# Patient Record
Sex: Male | Born: 1940
Health system: Southern US, Community
[De-identification: ages and names within clinical notes are randomized; demographics above are authoritative.]

## PROBLEM LIST (undated history)

## (undated) DIAGNOSIS — E119 Type 2 diabetes mellitus without complications: Secondary | ICD-10-CM

## (undated) DIAGNOSIS — N529 Male erectile dysfunction, unspecified: Secondary | ICD-10-CM

## (undated) DIAGNOSIS — E785 Hyperlipidemia, unspecified: Secondary | ICD-10-CM

## (undated) DIAGNOSIS — H409 Unspecified glaucoma: Secondary | ICD-10-CM

## (undated) DIAGNOSIS — I10 Essential (primary) hypertension: Secondary | ICD-10-CM

## (undated) HISTORY — DX: Hyperlipidemia, unspecified: E78.5

## (undated) HISTORY — DX: Unspecified glaucoma: H40.9

## (undated) HISTORY — DX: Essential (primary) hypertension: I10

## (undated) HISTORY — DX: Type 2 diabetes mellitus without complications: E11.9

## (undated) HISTORY — DX: Male erectile dysfunction, unspecified: N52.9

---

## 1998-09-26 ENCOUNTER — Ambulatory Visit (HOSPITAL_COMMUNITY): Admission: RE | Admit: 1998-09-26 | Discharge: 1998-09-26 | Payer: Self-pay | Admitting: Neurosurgery

## 1999-05-22 ENCOUNTER — Encounter: Payer: Self-pay | Admitting: Family Medicine

## 1999-05-22 ENCOUNTER — Ambulatory Visit (HOSPITAL_COMMUNITY): Admission: RE | Admit: 1999-05-22 | Discharge: 1999-05-22 | Payer: Self-pay | Admitting: Family Medicine

## 1999-09-23 ENCOUNTER — Encounter: Payer: Self-pay | Admitting: Neurosurgery

## 1999-09-25 ENCOUNTER — Inpatient Hospital Stay (HOSPITAL_COMMUNITY): Admission: RE | Admit: 1999-09-25 | Discharge: 1999-09-29 | Payer: Self-pay | Admitting: Neurosurgery

## 1999-09-25 ENCOUNTER — Encounter: Payer: Self-pay | Admitting: Neurosurgery

## 1999-09-26 ENCOUNTER — Encounter: Payer: Self-pay | Admitting: Neurosurgery

## 2002-07-19 ENCOUNTER — Encounter: Admission: RE | Admit: 2002-07-19 | Discharge: 2002-07-19 | Payer: Self-pay | Admitting: Family Medicine

## 2002-07-19 ENCOUNTER — Encounter: Payer: Self-pay | Admitting: Family Medicine

## 2003-01-03 ENCOUNTER — Emergency Department (HOSPITAL_COMMUNITY): Admission: EM | Admit: 2003-01-03 | Discharge: 2003-01-03 | Payer: Self-pay | Admitting: Emergency Medicine

## 2003-06-30 ENCOUNTER — Emergency Department (HOSPITAL_COMMUNITY): Admission: EM | Admit: 2003-06-30 | Discharge: 2003-06-30 | Payer: Self-pay | Admitting: Emergency Medicine

## 2003-07-31 ENCOUNTER — Ambulatory Visit (HOSPITAL_COMMUNITY): Admission: RE | Admit: 2003-07-31 | Discharge: 2003-07-31 | Payer: Self-pay | Admitting: Urology

## 2003-07-31 ENCOUNTER — Ambulatory Visit (HOSPITAL_BASED_OUTPATIENT_CLINIC_OR_DEPARTMENT_OTHER): Admission: RE | Admit: 2003-07-31 | Discharge: 2003-07-31 | Payer: Self-pay | Admitting: Urology

## 2003-08-23 ENCOUNTER — Ambulatory Visit (HOSPITAL_COMMUNITY): Admission: RE | Admit: 2003-08-23 | Discharge: 2003-08-23 | Payer: Self-pay | Admitting: Urology

## 2003-09-17 ENCOUNTER — Ambulatory Visit (HOSPITAL_COMMUNITY): Admission: RE | Admit: 2003-09-17 | Discharge: 2003-09-17 | Payer: Self-pay | Admitting: Urology

## 2004-07-23 ENCOUNTER — Ambulatory Visit: Payer: Self-pay | Admitting: Family Medicine

## 2004-08-25 ENCOUNTER — Ambulatory Visit: Payer: Self-pay | Admitting: Family Medicine

## 2004-09-01 ENCOUNTER — Ambulatory Visit: Payer: Self-pay | Admitting: Family Medicine

## 2004-09-02 ENCOUNTER — Ambulatory Visit: Payer: Self-pay | Admitting: Family Medicine

## 2004-09-16 ENCOUNTER — Ambulatory Visit: Payer: Self-pay | Admitting: Family Medicine

## 2004-10-10 ENCOUNTER — Ambulatory Visit: Payer: Self-pay | Admitting: Family Medicine

## 2005-11-30 ENCOUNTER — Ambulatory Visit: Payer: Self-pay | Admitting: Family Medicine

## 2005-12-10 IMAGING — CT CT ABDOMEN W/O CM
1 of 2 series · 15 of 32 positions shown, 19 images · non-contrast
Comparison: none

CLINICAL DATA: right-sided renal colic; nausea and vomiting; pain radiating to back; fever
TECHNIQUE: Multidetector helical CT of the abdomen and pelvis was performed following the renal stone protocol.  No oral or intravenous contrast was administered.  
 ABDOMEN CT WITHOUT CONTRAST
 Moderate right hydronephrosis is seen as well as ureterectasis.  No intrarenal calculi are seen.  There is no evidence of left-sided hydronephrosis.  Both kidneys are normal in contour on this noncontrast study.
 A small fluid density cyst is seen in the left hepatic lobe measuring approximately 1cm.  The other abdominal parenchymal organs are unremarkable in appearance on this noncontrast study.  There is no evidence of mass or inflammatory process.
 IMPRESSION
 Moderate right hydronephrosis.  See pelvis CT report below.  
 PELVIS CT WITHOUT CONTRAST
 Moderate right-sided ureterectasis is seen.  A calculus is seen in the distal right ureter approximately 2cm proximal to the ureterovesical junction which measures 6 x 7mm.
 Mildly enlarged prostate gland is seen.  Diverticulosis of the sigmoid colon is demonstrated however there is no evidence of diverticulitis.  There is no evidence of other inflammatory process or abnormal fluid collections.  No pelvic masses are identified.
 7 x 6mm calculus in the distal right ureter.  This results in moderate right hydroureteronephrosis.  
 Sigmoid diverticulosis and mildly enlarged prostate incidentally noted.

[Series 2: kidney sto 5.0 b30f · axial · 0.77mm/px · z∈[-498,-122]mm · 15 of 102 slices shown, 19 images]
[im 4/102  soft-tissue]
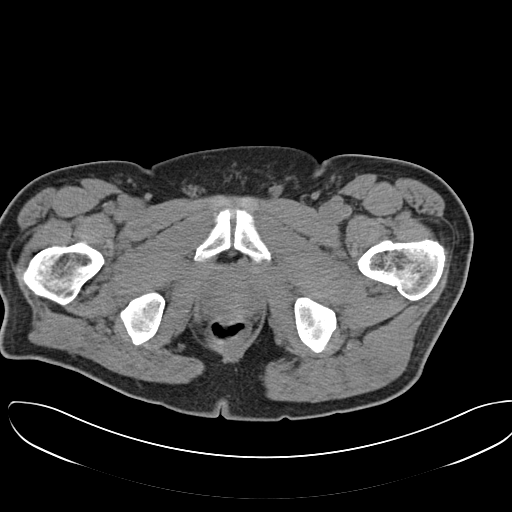
[im 4/102  bone]
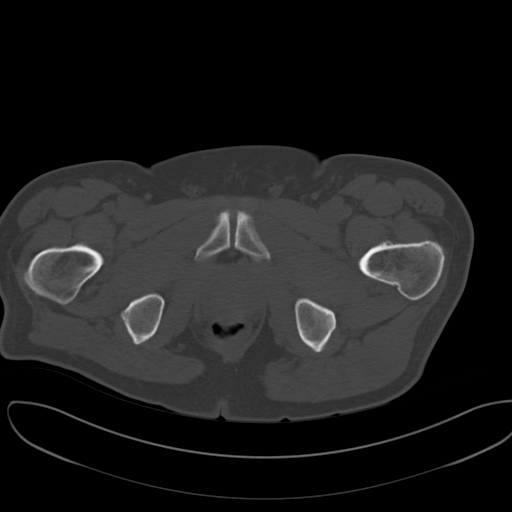
[im 12/102  soft-tissue]
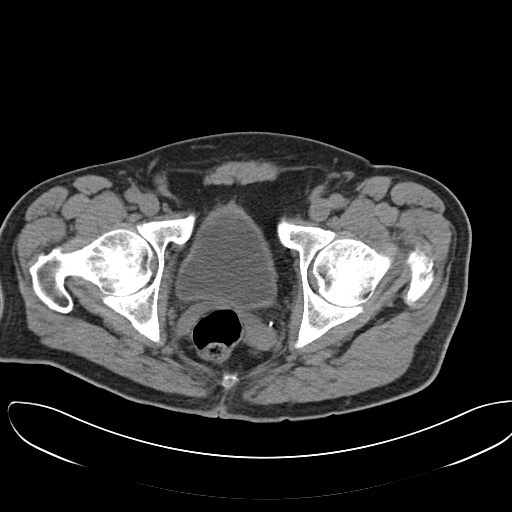
[im 20/102  soft-tissue]
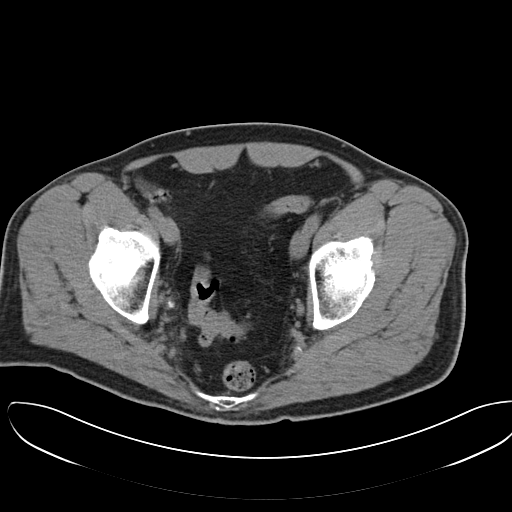
[im 28/102  soft-tissue]
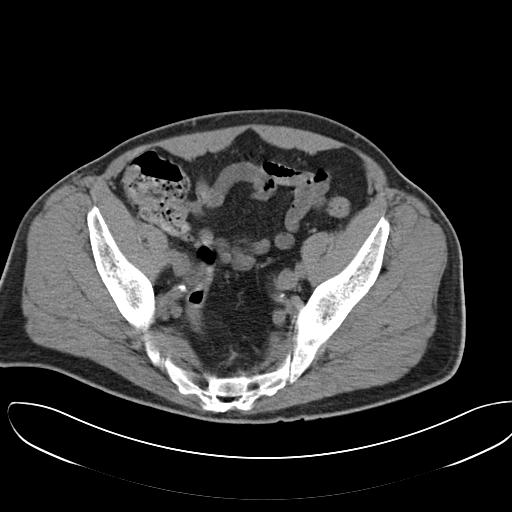
[im 35/102  soft-tissue]
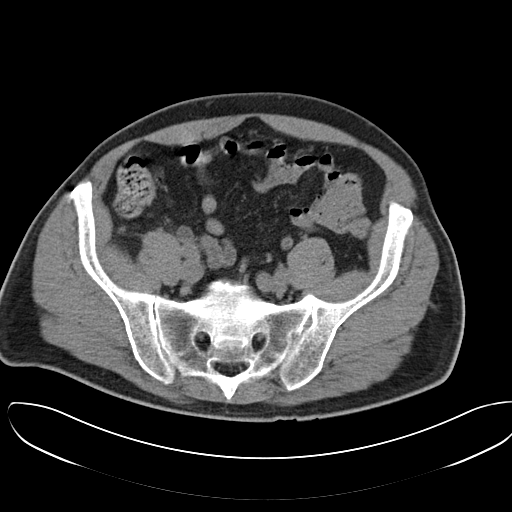
[im 43/102  soft-tissue]
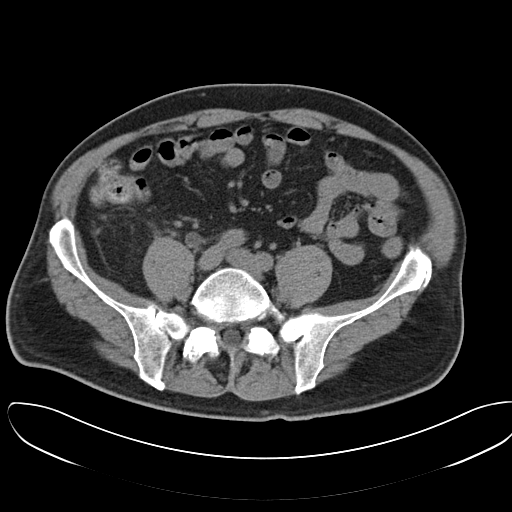
[im 51/102  soft-tissue]
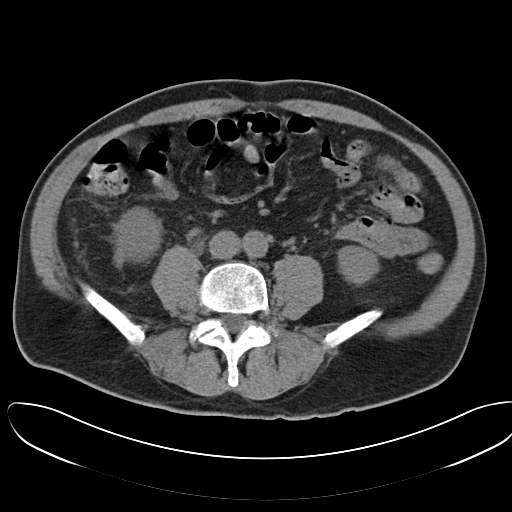
[im 59/102  soft-tissue]
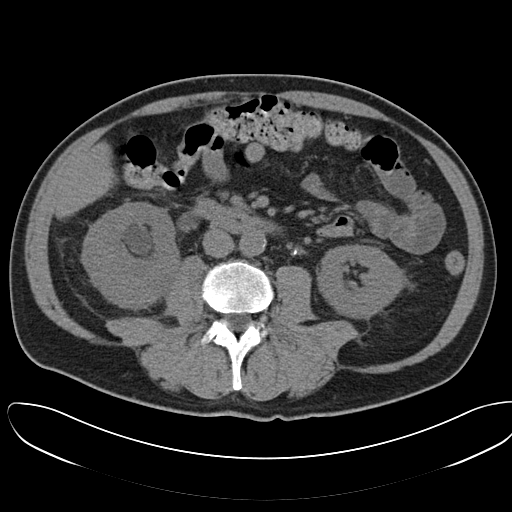
[im 67/102  soft-tissue]
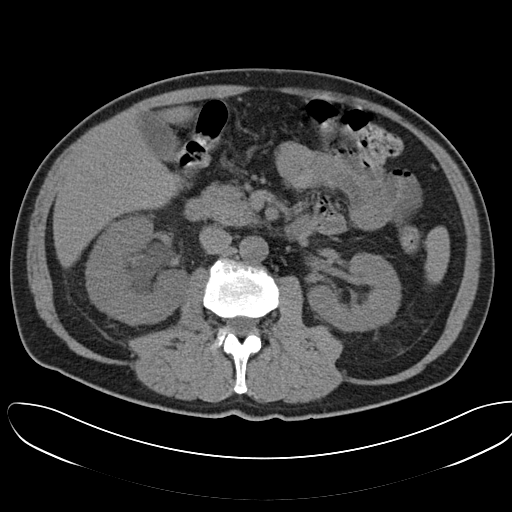
[im 67/102  bone]
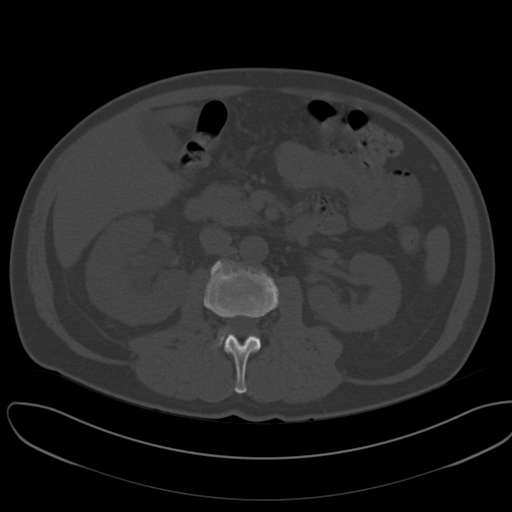
[im 74/102  soft-tissue]
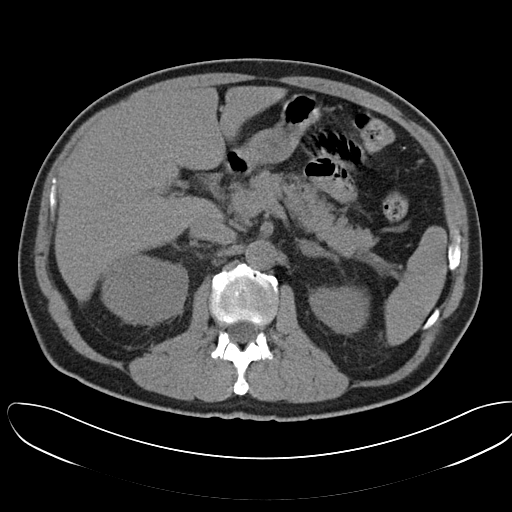
[im 82/102  soft-tissue]
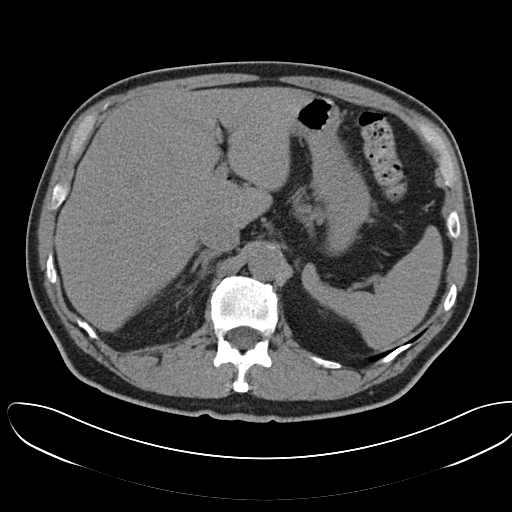
[im 86/102  lung]
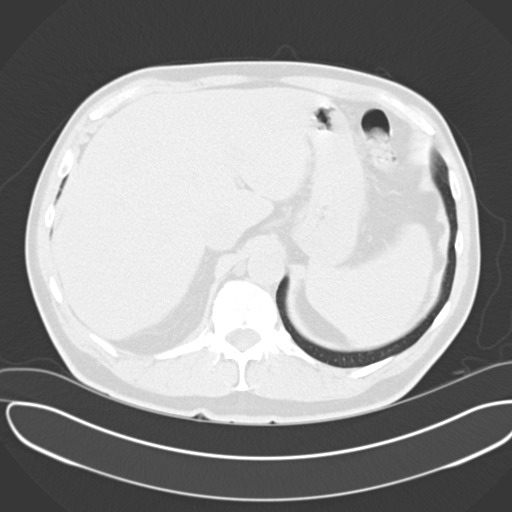
[im 90/102  soft-tissue]
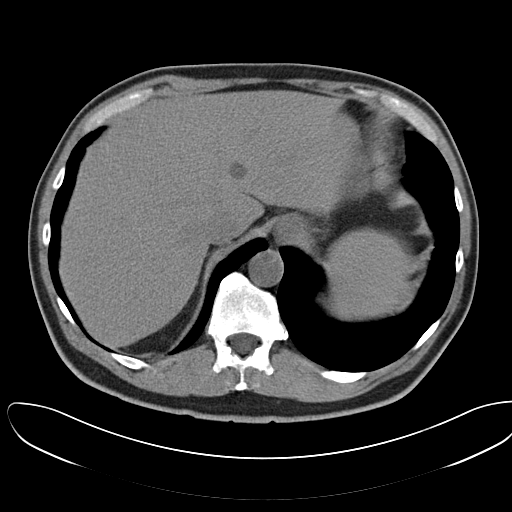
[im 90/102  lung]
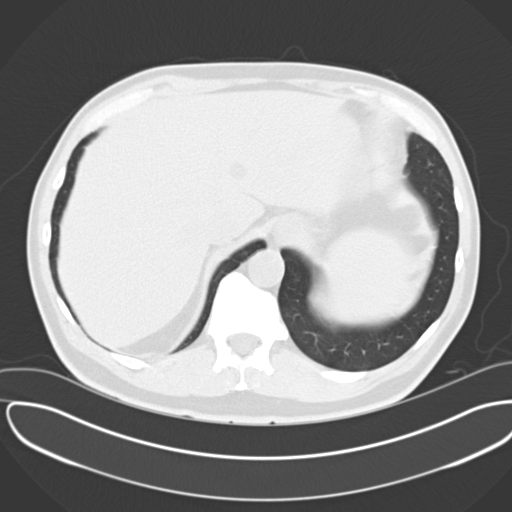
[im 94/102  lung]
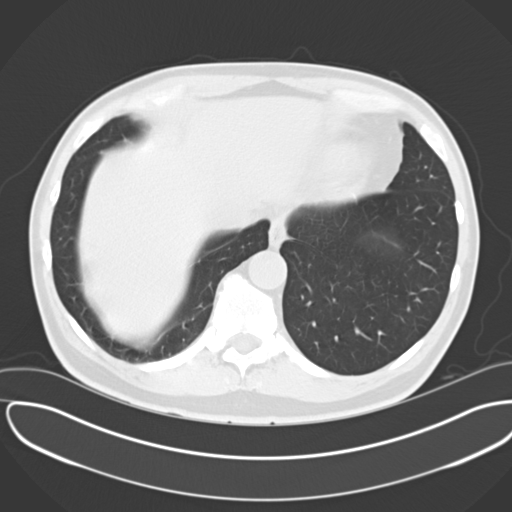
[im 98/102  soft-tissue]
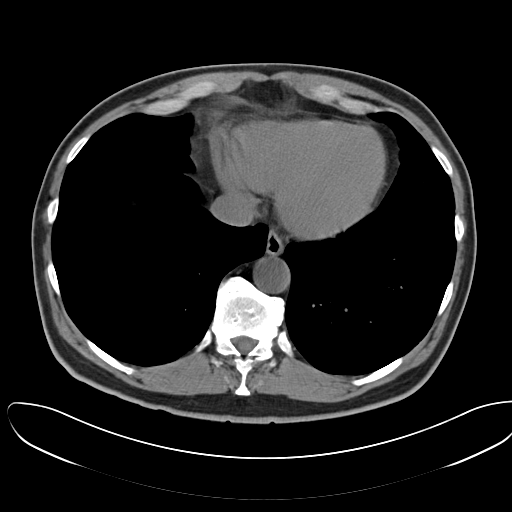
[im 98/102  lung]
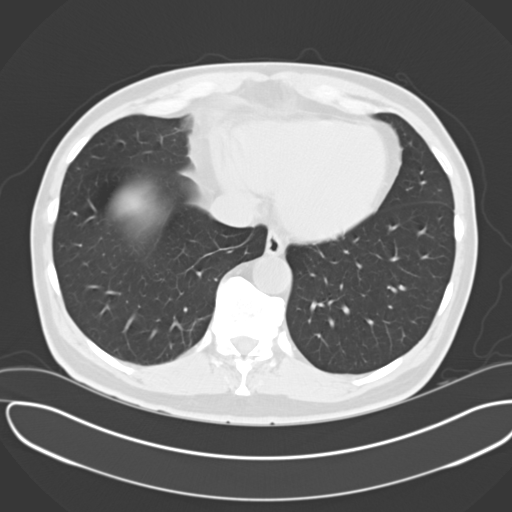

[15 of 32 positions shown; findings below may reference images not displayed]

## 2005-12-31 ENCOUNTER — Ambulatory Visit: Payer: Self-pay | Admitting: Family Medicine

## 2006-02-01 ENCOUNTER — Ambulatory Visit: Payer: Self-pay | Admitting: Family Medicine

## 2006-02-02 IMAGING — CR DG ABDOMEN 1V
1 series · 1 of 1 positions shown · non-contrast
Comparison: 07/31/03.

CLINICAL DATA: Left flank pain.
 ABDOMEN, ONE VIEW

[view not recorded]
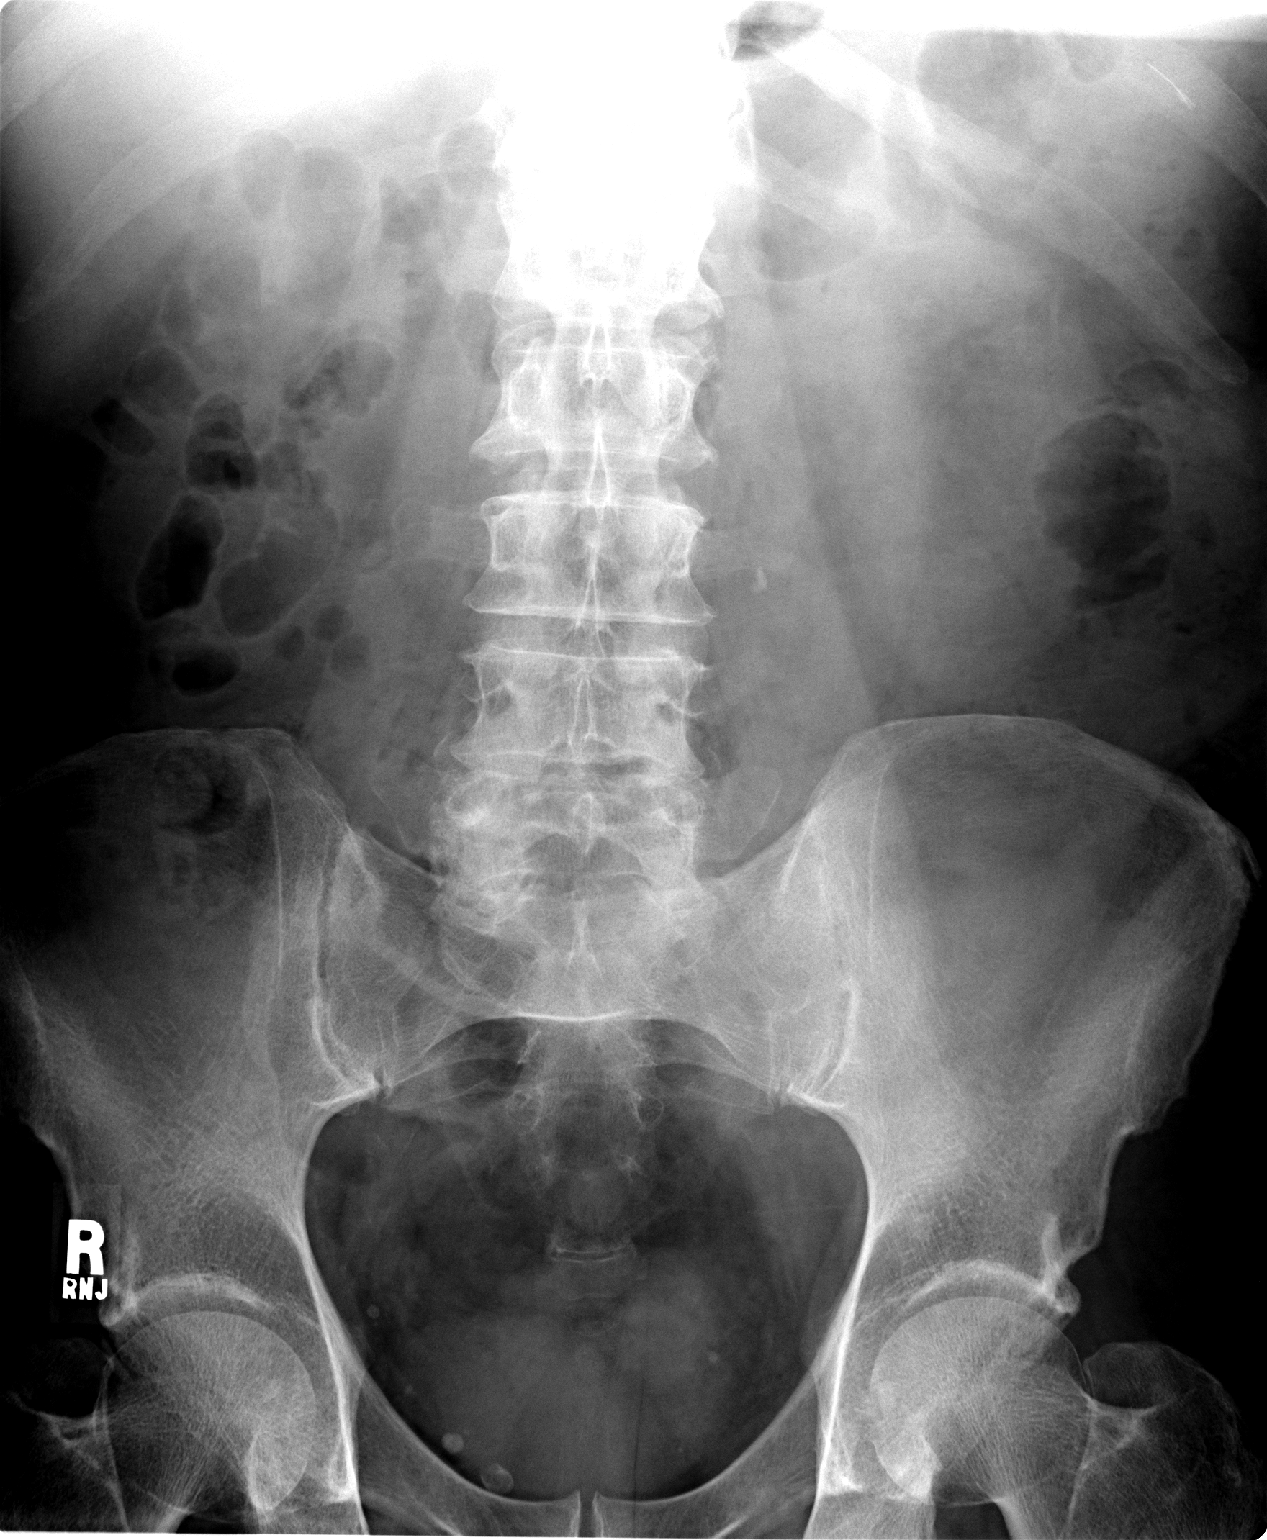

[1 of 1 positions shown; findings below may reference images not displayed]

The patient has a ureteral stone on the left at the level of the L3 vertebral body which measures 5 x 6 mm.  I do not see any other urinary tract calcification.  There are multiple phleboliths in the pelvis.  Mild degenerative changes affect the lumbar spine.  
 IMPRESSION
 5-6 mm mid left ureteral stone.

## 2006-02-27 IMAGING — CR DG ABDOMEN 1V
2 series · 2 of 2 positions shown · non-contrast
Comparison: 08/23/03.

CLINICAL DATA: Left ureteral calculus, lithotripsy.
 ABDOMEN, ONE VIEW

[view not recorded (1 of 2)]
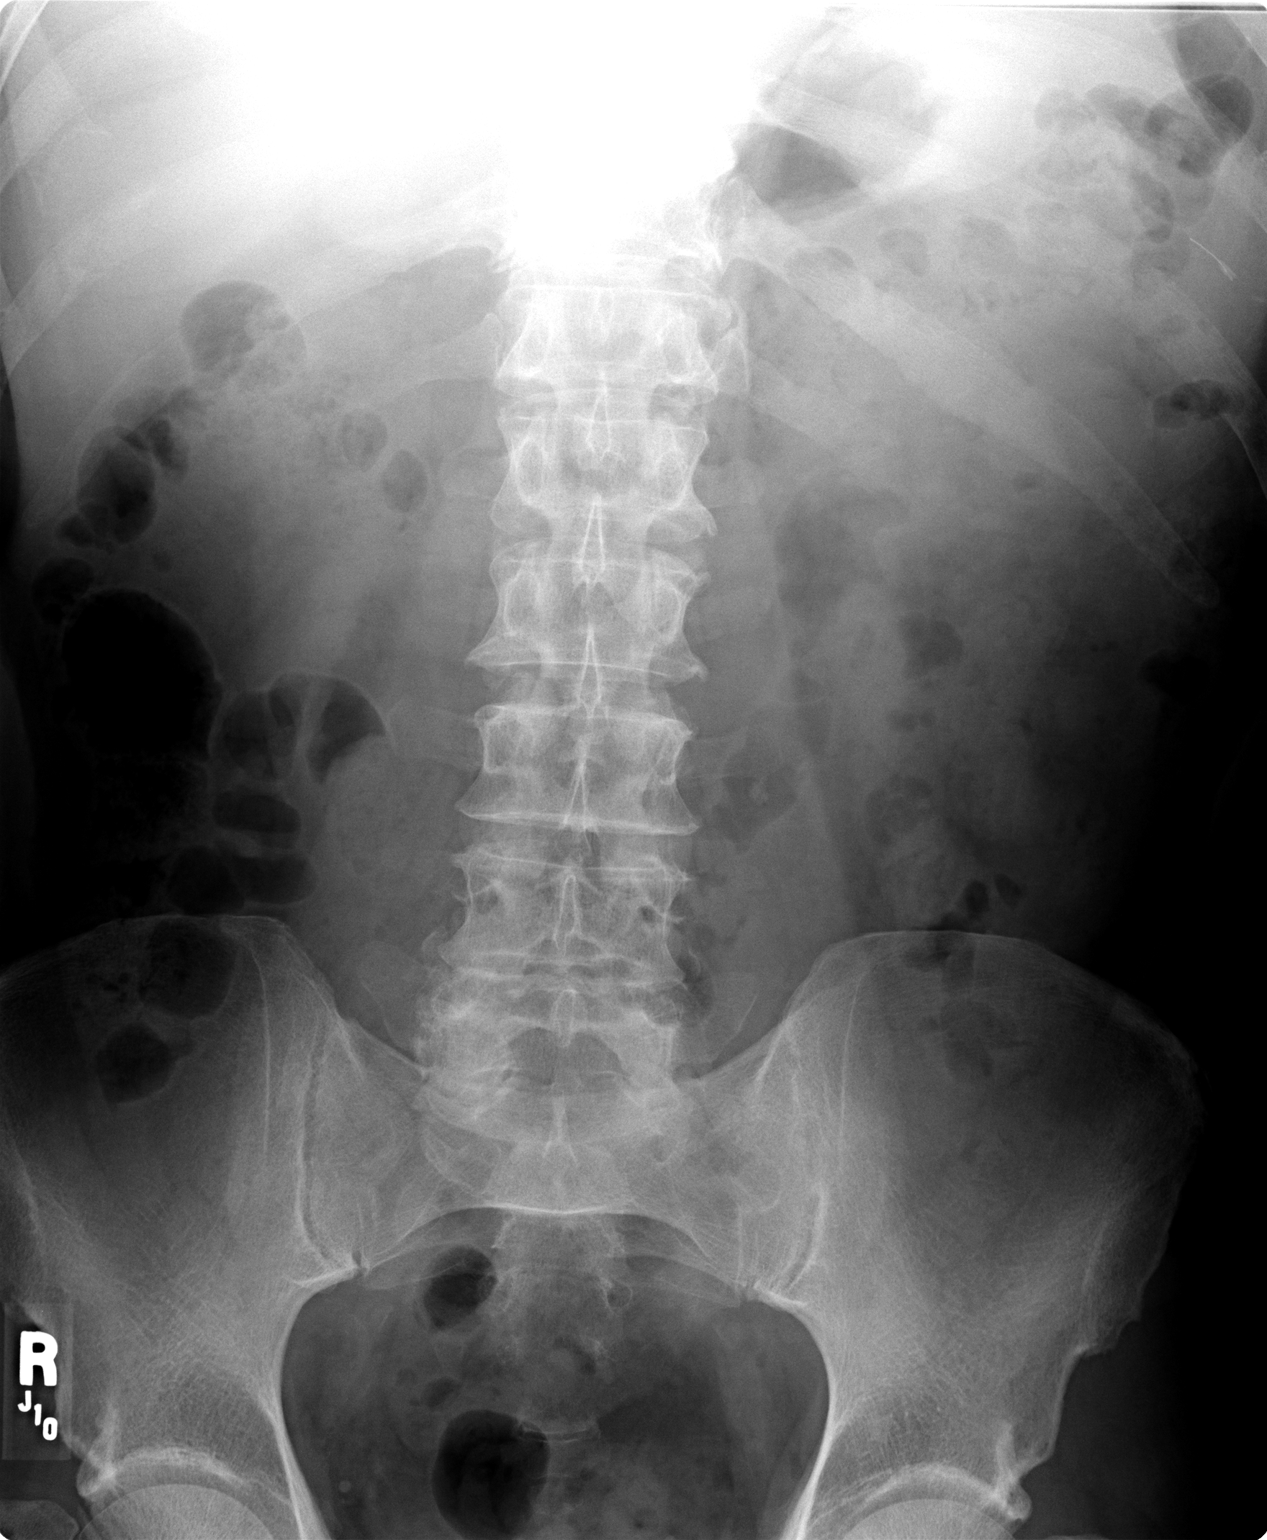

[view not recorded (2 of 2)]
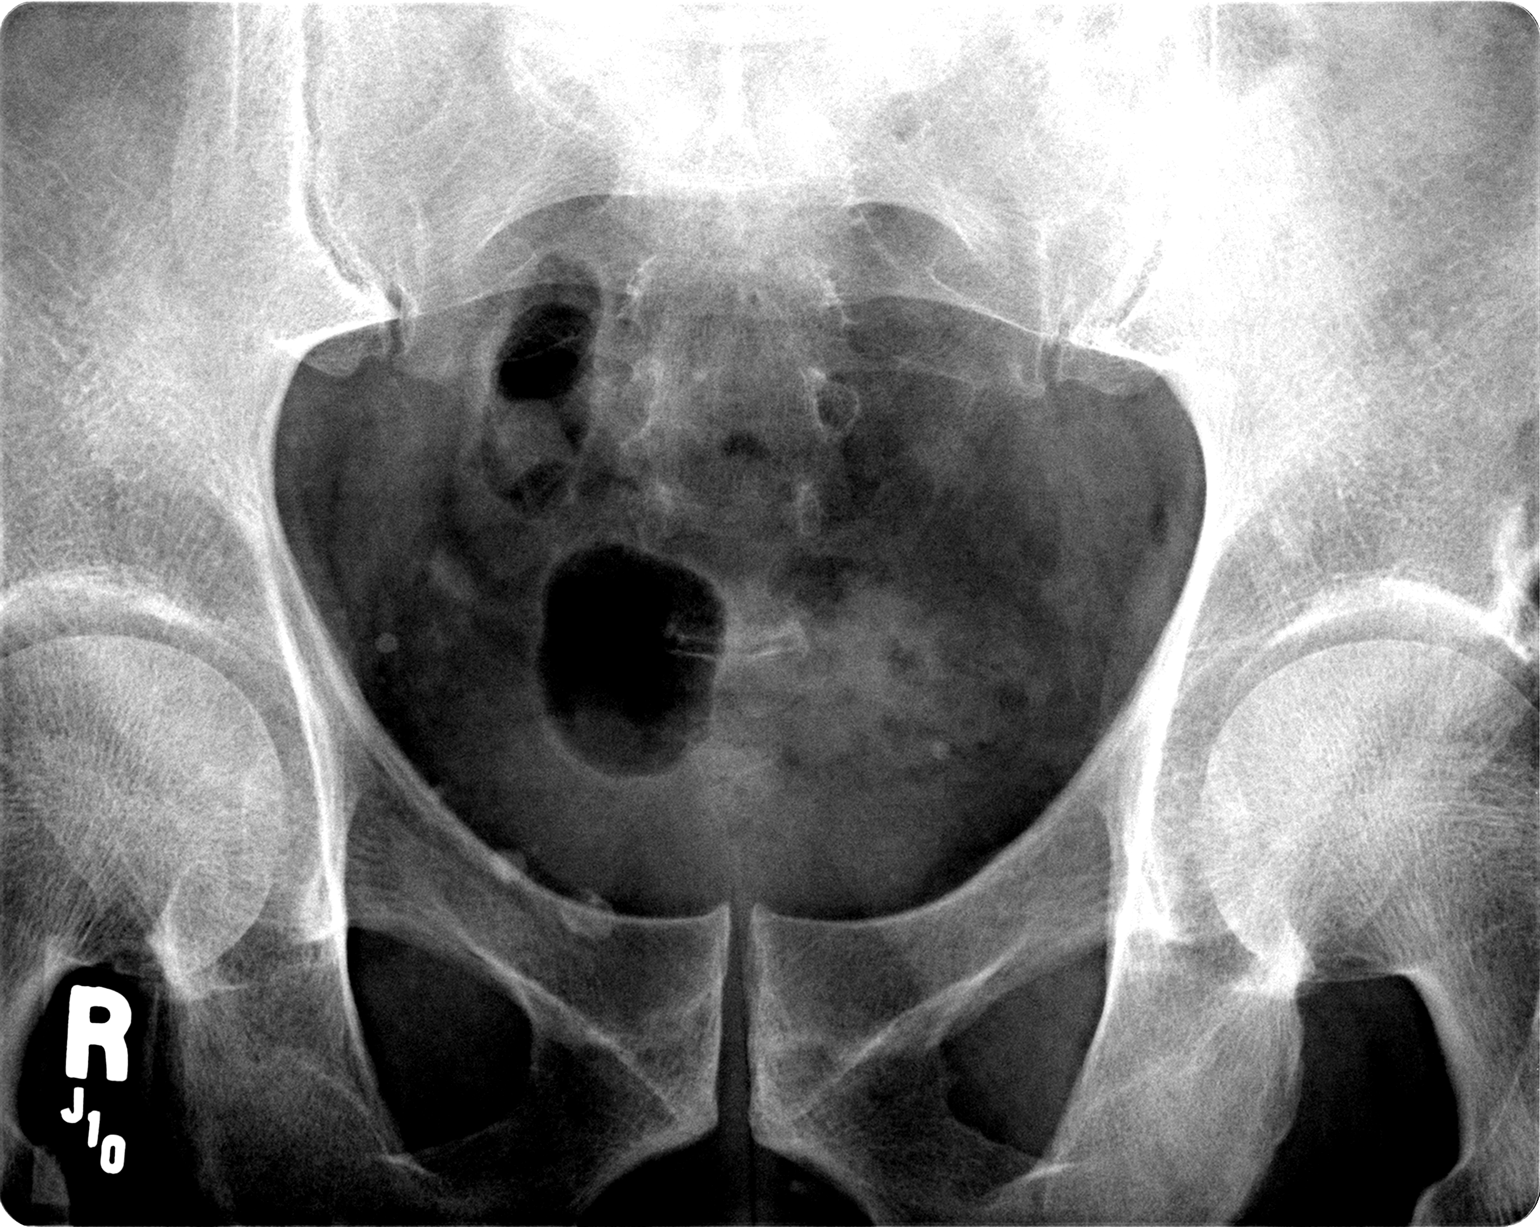

[2 of 2 positions shown; findings below may reference images not displayed]

An apparent calculus is seen inferior to the left transverse process of L3, 6.5 mm in greatest size.   No other potential urinary trace calcifications are evident.   Bowel gas pattern normal.   Small right phlebolith stable.   Minimal degenerative changes spine. 
 IMPRESSION
 6.5 mm diameter proximal to mid left ureteral calculus, unchanged, located just inferior to the left transverse process of L3.

## 2006-11-22 ENCOUNTER — Encounter: Payer: Self-pay | Admitting: Family Medicine

## 2006-11-22 DIAGNOSIS — I10 Essential (primary) hypertension: Secondary | ICD-10-CM

## 2006-11-22 DIAGNOSIS — F528 Other sexual dysfunction not due to a substance or known physiological condition: Secondary | ICD-10-CM | POA: Insufficient documentation

## 2006-11-22 DIAGNOSIS — E785 Hyperlipidemia, unspecified: Secondary | ICD-10-CM | POA: Insufficient documentation

## 2006-11-22 DIAGNOSIS — G500 Trigeminal neuralgia: Secondary | ICD-10-CM

## 2006-12-08 ENCOUNTER — Ambulatory Visit: Payer: Self-pay | Admitting: Family Medicine

## 2007-01-11 ENCOUNTER — Ambulatory Visit: Payer: Self-pay | Admitting: Family Medicine

## 2007-02-09 ENCOUNTER — Ambulatory Visit: Payer: Self-pay | Admitting: Gastroenterology

## 2007-02-23 ENCOUNTER — Encounter: Payer: Self-pay | Admitting: Family Medicine

## 2007-02-23 ENCOUNTER — Ambulatory Visit: Payer: Self-pay | Admitting: Gastroenterology

## 2007-05-20 ENCOUNTER — Ambulatory Visit: Payer: Self-pay | Admitting: Family Medicine

## 2008-02-06 ENCOUNTER — Encounter: Payer: Self-pay | Admitting: *Deleted

## 2008-02-28 ENCOUNTER — Encounter: Payer: Self-pay | Admitting: Family Medicine

## 2008-03-19 ENCOUNTER — Encounter: Payer: Self-pay | Admitting: Family Medicine

## 2008-03-20 ENCOUNTER — Ambulatory Visit: Payer: Self-pay | Admitting: Family Medicine

## 2008-03-20 LAB — CONVERTED CEMR LAB
Blood in Urine, dipstick: NEGATIVE
Glucose, Urine, Semiquant: NEGATIVE
Nitrite: NEGATIVE
Specific Gravity, Urine: 1.015
Urobilinogen, UA: 1
WBC Urine, dipstick: NEGATIVE
pH: 7

## 2008-03-27 LAB — CONVERTED CEMR LAB
ALT: 27 units/L (ref 0–53)
AST: 33 units/L (ref 0–37)
Albumin: 4.6 g/dL (ref 3.5–5.2)
Alkaline Phosphatase: 63 units/L (ref 39–117)
BUN: 17 mg/dL (ref 6–23)
Basophils Absolute: 0 10*3/uL (ref 0.0–0.1)
Basophils Relative: 0.2 % (ref 0.0–3.0)
Bilirubin, Direct: 0.1 mg/dL (ref 0.0–0.3)
CO2: 32 meq/L (ref 19–32)
Calcium: 9.5 mg/dL (ref 8.4–10.5)
Chloride: 106 meq/L (ref 96–112)
Cholesterol: 121 mg/dL (ref 0–200)
Creatinine, Ser: 1.2 mg/dL (ref 0.4–1.5)
Eosinophils Absolute: 0.1 10*3/uL (ref 0.0–0.7)
Eosinophils Relative: 1.2 % (ref 0.0–5.0)
GFR calc Af Amer: 78 mL/min
GFR calc non Af Amer: 64 mL/min
Glucose, Bld: 128 mg/dL — ABNORMAL HIGH (ref 70–99)
HCT: 45.2 % (ref 39.0–52.0)
HDL: 31.4 mg/dL — ABNORMAL LOW (ref >39.0)
Hemoglobin: 15.6 g/dL (ref 13.0–17.0)
LDL Cholesterol: 71 mg/dL (ref 0–99)
Lymphocytes Relative: 31 % (ref 12.0–46.0)
MCHC: 34.4 g/dL (ref 30.0–36.0)
MCV: 94.7 fL (ref 78.0–100.0)
Monocytes Absolute: 0.5 10*3/uL (ref 0.1–1.0)
Monocytes Relative: 7.2 % (ref 3.0–12.0)
Neutro Abs: 3.9 10*3/uL (ref 1.4–7.7)
Neutrophils Relative %: 60.4 % (ref 43.0–77.0)
PSA: 1.2 ng/mL (ref 0.10–4.00)
Platelets: 249 10*3/uL (ref 150–400)
Potassium: 4.7 meq/L (ref 3.5–5.1)
RBC: 4.77 M/uL (ref 4.22–5.81)
RDW: 11.8 % (ref 11.5–14.6)
Sodium: 142 meq/L (ref 135–145)
TSH: 1.27 microintl units/mL (ref 0.35–5.50)
Total Bilirubin: 1.2 mg/dL (ref 0.3–1.2)
Total CHOL/HDL Ratio: 3.9
Total Protein: 7.4 g/dL (ref 6.0–8.3)
Triglycerides: 94 mg/dL (ref 0–149)
VLDL: 19 mg/dL (ref 0–40)
WBC: 6.5 10*3/uL (ref 4.5–10.5)

## 2008-04-17 ENCOUNTER — Ambulatory Visit: Payer: Self-pay | Admitting: Family Medicine

## 2009-03-21 ENCOUNTER — Ambulatory Visit: Payer: Self-pay | Admitting: Family Medicine

## 2009-03-21 DIAGNOSIS — M5137 Other intervertebral disc degeneration, lumbosacral region: Secondary | ICD-10-CM

## 2009-05-29 ENCOUNTER — Ambulatory Visit: Payer: Self-pay | Admitting: Family Medicine

## 2010-04-11 ENCOUNTER — Encounter: Payer: Self-pay | Admitting: Family Medicine

## 2010-04-11 ENCOUNTER — Ambulatory Visit: Payer: Self-pay | Admitting: Family Medicine

## 2010-04-11 LAB — CONVERTED CEMR LAB
Blood in Urine, dipstick: NEGATIVE
Glucose, Urine, Semiquant: NEGATIVE
Nitrite: NEGATIVE
Specific Gravity, Urine: 1.025
Urobilinogen, UA: 1
WBC Urine, dipstick: NEGATIVE
pH: 6.5

## 2010-04-14 LAB — CONVERTED CEMR LAB
ALT: 27 units/L (ref 0–53)
AST: 35 units/L (ref 0–37)
Albumin: 4.7 g/dL (ref 3.5–5.2)
Alkaline Phosphatase: 68 units/L (ref 39–117)
BUN: 15 mg/dL (ref 6–23)
Basophils Absolute: 0 10*3/uL (ref 0.0–0.1)
Basophils Relative: 0.2 % (ref 0.0–3.0)
Bilirubin, Direct: 0.2 mg/dL (ref 0.0–0.3)
CO2: 30 meq/L (ref 19–32)
Calcium: 9.6 mg/dL (ref 8.4–10.5)
Chloride: 101 meq/L (ref 96–112)
Cholesterol: 119 mg/dL (ref 0–200)
Creatinine, Ser: 1.1 mg/dL (ref 0.4–1.5)
Eosinophils Absolute: 0.1 10*3/uL (ref 0.0–0.7)
Eosinophils Relative: 0.9 % (ref 0.0–5.0)
GFR calc non Af Amer: 71.93 mL/min (ref >60)
Glucose, Bld: 130 mg/dL — ABNORMAL HIGH (ref 70–99)
HCT: 45.3 % (ref 39.0–52.0)
HDL: 37.4 mg/dL — ABNORMAL LOW (ref >39.00)
Hemoglobin: 15.5 g/dL (ref 13.0–17.0)
LDL Cholesterol: 64 mg/dL (ref 0–99)
Lymphocytes Relative: 19.1 % (ref 12.0–46.0)
Lymphs Abs: 2.1 10*3/uL (ref 0.7–4.0)
MCHC: 34.3 g/dL (ref 30.0–36.0)
MCV: 92.8 fL (ref 78.0–100.0)
Monocytes Absolute: 0.7 10*3/uL (ref 0.1–1.0)
Monocytes Relative: 6.4 % (ref 3.0–12.0)
Neutro Abs: 7.9 10*3/uL — ABNORMAL HIGH (ref 1.4–7.7)
Neutrophils Relative %: 73.4 % (ref 43.0–77.0)
PSA: 1.52 ng/mL (ref 0.10–4.00)
Platelets: 263 10*3/uL (ref 150.0–400.0)
Potassium: 4.9 meq/L (ref 3.5–5.1)
RBC: 4.88 M/uL (ref 4.22–5.81)
RDW: 12.8 % (ref 11.5–14.6)
Sodium: 140 meq/L (ref 135–145)
TSH: 1.35 microintl units/mL (ref 0.35–5.50)
Testosterone: 336.85 ng/dL — ABNORMAL LOW (ref 350.00–890.00)
Total Bilirubin: 0.9 mg/dL (ref 0.3–1.2)
Total CHOL/HDL Ratio: 3
Total Protein: 7.4 g/dL (ref 6.0–8.3)
Triglycerides: 89 mg/dL (ref 0.0–149.0)
VLDL: 17.8 mg/dL (ref 0.0–40.0)
WBC: 10.8 10*3/uL — ABNORMAL HIGH (ref 4.5–10.5)

## 2010-05-08 ENCOUNTER — Telehealth: Payer: Self-pay | Admitting: Family Medicine

## 2010-05-12 ENCOUNTER — Telehealth: Payer: Self-pay | Admitting: Family Medicine

## 2010-08-17 LAB — CONVERTED CEMR LAB
ALT: 22 units/L (ref 0–40)
ALT: 28 units/L (ref 0–53)
AST: 25 units/L (ref 0–37)
AST: 35 units/L (ref 0–37)
Albumin: 4.3 g/dL (ref 3.5–5.2)
Albumin: 4.5 g/dL (ref 3.5–5.2)
Alkaline Phosphatase: 78 units/L (ref 39–117)
Alkaline Phosphatase: 80 units/L (ref 39–117)
BUN: 14 mg/dL (ref 6–23)
BUN: 18 mg/dL (ref 6–23)
Basophils Absolute: 0 10*3/uL (ref 0.0–0.1)
Basophils Absolute: 0 10*3/uL (ref 0.0–0.1)
Basophils Relative: 0.2 % (ref 0.0–3.0)
Basophils Relative: 0.3 % (ref 0.0–1.0)
Bilirubin, Direct: 0.1 mg/dL (ref 0.0–0.3)
Bilirubin, Direct: 0.2 mg/dL (ref 0.0–0.3)
CO2: 31 meq/L (ref 19–32)
CO2: 31 meq/L (ref 19–32)
Calcium: 9.3 mg/dL (ref 8.4–10.5)
Calcium: 9.6 mg/dL (ref 8.4–10.5)
Chloride: 102 meq/L (ref 96–112)
Chloride: 103 meq/L (ref 96–112)
Cholesterol: 140 mg/dL (ref 0–200)
Cholesterol: 148 mg/dL (ref 0–200)
Creatinine, Ser: 1.1 mg/dL (ref 0.4–1.5)
Creatinine, Ser: 1.3 mg/dL (ref 0.4–1.5)
Eosinophils Absolute: 0.1 10*3/uL (ref 0.0–0.6)
Eosinophils Absolute: 0.1 10*3/uL (ref 0.0–0.7)
Eosinophils Relative: 1.4 % (ref 0.0–5.0)
Eosinophils Relative: 1.7 % (ref 0.0–5.0)
GFR calc Af Amer: 71 mL/min
GFR calc non Af Amer: 59 mL/min
GFR calc non Af Amer: 70.64 mL/min (ref >60)
Glucose, Bld: 121 mg/dL — ABNORMAL HIGH (ref 70–99)
Glucose, Bld: 125 mg/dL — ABNORMAL HIGH (ref 70–99)
HCT: 44.5 % (ref 39.0–52.0)
HCT: 44.6 % (ref 39.0–52.0)
HDL: 35.4 mg/dL — ABNORMAL LOW (ref >39.00)
HDL: 36.3 mg/dL — ABNORMAL LOW (ref >39.0)
Hemoglobin: 15.1 g/dL (ref 13.0–17.0)
Hemoglobin: 15.6 g/dL (ref 13.0–17.0)
Hgb A1c MFr Bld: 6.5 % — ABNORMAL HIGH (ref 4.6–6.0)
LDL Cholesterol: 86 mg/dL (ref 0–99)
LDL Cholesterol: 95 mg/dL (ref 0–99)
Lymphocytes Relative: 25.8 % (ref 12.0–46.0)
Lymphocytes Relative: 33 % (ref 12.0–46.0)
Lymphs Abs: 2.7 10*3/uL (ref 0.7–4.0)
MCHC: 34 g/dL (ref 30.0–36.0)
MCHC: 34.9 g/dL (ref 30.0–36.0)
MCV: 91.2 fL (ref 78.0–100.0)
MCV: 92.2 fL (ref 78.0–100.0)
Monocytes Absolute: 0.5 10*3/uL (ref 0.2–0.7)
Monocytes Absolute: 0.6 10*3/uL (ref 0.1–1.0)
Monocytes Relative: 6.4 % (ref 3.0–11.0)
Monocytes Relative: 7.6 % (ref 3.0–12.0)
Neutro Abs: 4.8 10*3/uL (ref 1.4–7.7)
Neutro Abs: 5.7 10*3/uL (ref 1.4–7.7)
Neutrophils Relative %: 57.5 % (ref 43.0–77.0)
Neutrophils Relative %: 66.1 % (ref 43.0–77.0)
PSA: 1.24 ng/mL (ref 0.10–4.00)
PSA: 1.25 ng/mL (ref 0.10–4.00)
Platelets: 238 10*3/uL (ref 150.0–400.0)
Platelets: 280 10*3/uL (ref 150–400)
Potassium: 3.7 meq/L (ref 3.5–5.1)
Potassium: 4.3 meq/L (ref 3.5–5.1)
RBC: 4.83 M/uL (ref 4.22–5.81)
RBC: 4.89 M/uL (ref 4.22–5.81)
RDW: 11.7 % (ref 11.5–14.6)
RDW: 11.7 % (ref 11.5–14.6)
Sodium: 139 meq/L (ref 135–145)
Sodium: 141 meq/L (ref 135–145)
TSH: 1.22 microintl units/mL (ref 0.35–5.50)
TSH: 1.47 microintl units/mL (ref 0.35–5.50)
Total Bilirubin: 1.1 mg/dL (ref 0.3–1.2)
Total Bilirubin: 1.3 mg/dL — ABNORMAL HIGH (ref 0.3–1.2)
Total CHOL/HDL Ratio: 4
Total CHOL/HDL Ratio: 4.1
Total Protein: 7.2 g/dL (ref 6.0–8.3)
Total Protein: 7.3 g/dL (ref 6.0–8.3)
Triglycerides: 86 mg/dL (ref 0–149)
Triglycerides: 92 mg/dL (ref 0.0–149.0)
VLDL: 17 mg/dL (ref 0–40)
VLDL: 18.4 mg/dL (ref 0.0–40.0)
WBC: 8.2 10*3/uL (ref 4.5–10.5)
WBC: 8.5 10*3/uL (ref 4.5–10.5)

## 2010-08-19 NOTE — Assessment & Plan Note (Signed)
Summary: pt will come in fasting/njr   Vital Signs:  Patient profile:   70 year old male Height:      71.5 inches Weight:      206 pounds BMI:     28.43 Temp:     98.9 degrees F oral BP sitting:   170 / 92  (right arm) Cuff size:   regular  Vitals Entered By: Kathrynn Speed CMA (April 11, 2010 9:45 AM) CC: cpx, fasting src Is Patient Diabetic? No   CC:  cpx and fasting src.  History of Present Illness: Daniel Cochran is a 70 year old, married male, nonsmoker, who comes in today for evaluation of hypertension, hyperlipidemia, and erectile dysfunction.and a initial  Medicare wellness evaluation  His hypertension is treated with labetalol 400 mg in the morning 200 mg at bedtime, Norvasc, 10 mg daily, clonidine, .1 b.i.d., Lasix, 40 mg b.i.d., BP year, 170/92.  BP at home, normal.  He also takes V  50 mg daily p.r.n. for ED, and Zocor 40 mg nightly for hyperlipidemia, along with an 81 mg, baby aspirin. Here for Medicare AWV:  1.   Risk factors based on Past M, S, F history:..reviewed in detail did have severe low back pain, which resulted in some peripheral numbness of his right leg pain on treated by a chiropractor 2.   Physical Activities: ...continues to work on the farm 3.   Depression/mood: good mood.  No depression 4.   Hearing: hearing normal 5.   ADL's: functions normally.  No restrictions 6.   Fall Risk: reviewed none 7.   Home Safety: gun safe at the house 8.   Height, weight, &visual acuity:.. height, weight, normal.  Visual acuity normal by ophthalmologist glaucoma screening twice yearly 9.   Counseling: .......continue current medications BP check q. a.m. x 3 weeks return if elevated 10.   Labs ordered based on risk factors: ......done today 11.           Referral Coordination........none indicated 12.           Care Plan.........Marland Kitchenreviewed in detail 13.            Cognitive Assessment ......Marland Kitchenoriented x 3 able to do all his own financial calculations  independently  Preventive Screening-Counseling & Management  Alcohol-Tobacco     Smoking Status: never  Current Medications (verified): 1)  Labetalol Hcl 200 Mg  Tabs (Labetalol Hcl) .... 2 in Am, 1 in Pm 2)  Norvasc 10 Mg  Tabs (Amlodipine Besylate) .... Take One Tab Once Daily 3)  Adult Aspirin Low Strength 81 Mg  Tbdp (Aspirin) .... Once Daily 4)  Clonidine Hcl 0.1 Mg  Tabs (Clonidine Hcl) .... Take One Tab Two Times A Day 5)  Furosemide 40 Mg  Tabs (Furosemide) .... Take One Tab Two Times A Day 6)  Viagra 50 Mg  Tabs (Sildenafil Citrate) .... Use As Directed 7)  Zocor 40 Mg  Tabs (Simvastatin) .... Take One Tab Once Daily  Allergies (verified): No Known Drug Allergies  Past History:  Past medical, surgical, family and social histories (including risk factors) reviewed, and no changes noted (except as noted below).  Past Medical History: Reviewed history from 03/20/2008 and no changes required. Hyperlipidemia Hypertension erectile dysfunction  Family History: Reviewed history from 03/20/2008 and no changes required. Family History High cholesterol Family History Hypertension  Social History: Reviewed history from 03/20/2008 and no changes required. Occupation: farmer Married Never Smoked Alcohol use-no Drug use-no Regular exercise-yes  Review of Systems  See HPI  Physical Exam  General:  Well-developed,well-nourished,in no acute distress; alert,appropriate and cooperative throughout examination Head:  Normocephalic and atraumatic without obvious abnormalities. No apparent alopecia or balding. Eyes:  No corneal or conjunctival inflammation noted. EOMI. Perrla. Funduscopic exam benign, without hemorrhages, exudates or papilledema. Vision grossly normal. Ears:  External ear exam shows no significant lesions or deformities.  Otoscopic examination reveals clear canals, tympanic membranes are intact bilaterally without bulging, retraction, inflammation or  discharge. Hearing is grossly normal bilaterally. Nose:  External nasal examination shows no deformity or inflammation. Nasal mucosa are pink and moist without lesions or exudates. Mouth:  Oral mucosa and oropharynx without lesions or exudates.  Teeth in good repair. Neck:  No deformities, masses, or tenderness noted. Chest Wall:  No deformities, masses, tenderness or gynecomastia noted. Breasts:  No masses or gynecomastia noted Lungs:  Normal respiratory effort, chest expands symmetrically. Lungs are clear to auscultation, no crackles or wheezes. Heart:  Normal rate and regular rhythm. S1 and S2 normal without gallop, murmur, click, rub or other extra sounds. Abdomen:  Bowel sounds positive,abdomen soft and non-tender without masses, organomegaly or hernias noted. Rectal:  No external abnormalities noted. Normal sphincter tone. No rectal masses or tenderness. Genitalia:  Testes bilaterally descended without nodularity, tenderness or masses. No scrotal masses or lesions. No penis lesions or urethral discharge. Prostate:  Prostate gland firm and smooth, no enlargement, nodularity, tenderness, mass, asymmetry or induration. Msk:  No deformity or scoliosis noted of thoracic or lumbar spine.   Pulses:  R and L carotid,radial,femoral,dorsalis pedis and posterior tibial pulses are full and equal bilaterally Extremities:  No clubbing, cyanosis, edema, or deformity noted with normal full range of motion of all joints.   Neurologic:  No cranial nerve deficits noted. Station and gait are normal. Plantar reflexes are down-going bilaterally. DTRs are symmetrical throughout. Sensory, motor and coordinative functions appear intact. Skin:  Intact without suspicious lesions or rashes Cervical Nodes:  No lymphadenopathy noted Axillary Nodes:  No palpable lymphadenopathy Inguinal Nodes:  No significant adenopathy Psych:  Cognition and judgment appear intact. Alert and cooperative with normal attention span and  concentration. No apparent delusions, illusions, hallucinations   Impression & Recommendations:  Problem # 1:  ERECTILE DYSFUNCTION (ICD-302.72) Assessment Deteriorated  The following medications were removed from the medication list:    Viagra 50 Mg Tabs (Sildenafil citrate) ..... Use as directed His updated medication list for this problem includes:    Viagra 100 Mg Tabs (Sildenafil citrate) ..... Uad  Orders: TLB-Lipid Panel (80061-LIPID) TLB-BMP (Basic Metabolic Panel-BMET) (80048-METABOL) TLB-CBC Platelet - w/Differential (85025-CBCD) TLB-Hepatic/Liver Function Pnl (80076-HEPATIC) TLB-TSH (Thyroid Stimulating Hormone) (84443-TSH) TLB-PSA (Prostate Specific Antigen) (84153-PSA) Prescription Created Electronically [phone]) Medicare -1st Annual Wellness Visit [phone]) Urinalysis-dipstick only (Medicare patient) (09811BJ) Specimen Handling (47829) TLB-Testosterone, Total (84403-TESTO)  Problem # 2:  HYPERTENSION (ICD-401.9) Assessment: Deteriorated  His updated medication list for this problem includes:    Labetalol Hcl 200 Mg Tabs (Labetalol hcl) .Marland Kitchen... 2 in am, 1 in pm    Norvasc 10 Mg Tabs (Amlodipine besylate) .Marland Kitchen... Take one tab once daily    Clonidine Hcl 0.1 Mg Tabs (Clonidine hcl) .Marland Kitchen... Take one tab two times a day    Furosemide 40 Mg Tabs (Furosemide) .Marland Kitchen... Take one tab two times a day  Orders: EKG w/ Interpretation (93000) TLB-Lipid Panel (80061-LIPID) TLB-BMP (Basic Metabolic Panel-BMET) (80048-METABOL) TLB-CBC Platelet - w/Differential (85025-CBCD) TLB-Hepatic/Liver Function Pnl (80076-HEPATIC) TLB-TSH (Thyroid Stimulating Hormone) (84443-TSH) TLB-PSA (Prostate Specific Antigen) (84153-PSA) Prescription Created Electronically [phone])  Medicare -1st Annual Wellness Visit [phone]) Urinalysis-dipstick only (Medicare patient) [phone]) Specimen Handling (81191) TLB-Testosterone, Total (84403-TESTO)  Problem # 3:  HYPERLIPIDEMIA (ICD-272.4) Assessment:  Improved  His updated medication list for this problem includes:    Zocor 40 Mg Tabs (Simvastatin) .Marland Kitchen... Take one tab once daily  Orders: EKG w/ Interpretation (93000) TLB-Lipid Panel (80061-LIPID) TLB-BMP (Basic Metabolic Panel-BMET) (80048-METABOL) TLB-CBC Platelet - w/Differential (85025-CBCD) TLB-Hepatic/Liver Function Pnl (80076-HEPATIC) TLB-TSH (Thyroid Stimulating Hormone) (84443-TSH) TLB-PSA (Prostate Specific Antigen) (84153-PSA) Prescription Created Electronically [phone]) Medicare -1st Annual Wellness Visit [phone]) Urinalysis-dipstick only (Medicare patient) (08657QI) Specimen Handling (69629) TLB-Testosterone, Total (84403-TESTO)  Problem # 4:  Preventive Health Care (ICD-V70.0) Assessment: Unchanged  Complete Medication List: 1)  Labetalol Hcl 200 Mg Tabs (Labetalol hcl) .... 2 in am, 1 in pm 2)  Norvasc 10 Mg Tabs (Amlodipine besylate) .... Take one tab once daily 3)  Adult Aspirin Low Strength 81 Mg Tbdp (Aspirin) .... Once daily 4)  Clonidine Hcl 0.1 Mg Tabs (Clonidine hcl) .... Take one tab two times a day 5)  Furosemide 40 Mg Tabs (Furosemide) .... Take one tab two times a day 6)  Zocor 40 Mg Tabs (Simvastatin) .... Take one tab once daily 7)  Viagra 100 Mg Tabs (Sildenafil citrate) .... Uad  Other Orders: Venipuncture (52841) Flu Vaccine 73yrs + (32440) Admin 1st Vaccine (10272)  Patient Instructions: 1)  check your blood pressure daily for 3 weeks if her blood pressure is not at goal..........Marland Kitchen 140/90 or less........Marland Kitchen bring the data and the device, and return to see me. 2)  Please schedule a follow-up appointment in 1 year. 3)  It is important that you exercise regularly at least 20 minutes 5 times a week. If you develop chest pain, have severe difficulty breathing, or feel very tired , stop exercising immediately and seek medical attention. 4)  Schedule a colonoscopy/sigmoidoscopy to help detect colon cancer. 5)  Take an Aspirin every  day. Prescriptions: ZOCOR 40 MG  TABS (SIMVASTATIN) take one tab once daily  #100 x 3   Entered and Authorized by:   Roderick Pee MD   Signed by:   Roderick Pee MD on 04/11/2010   Method used:   Electronically to        [facility]  [phone]* (retail)       [address]       [place], [place]  [address]       Ph: [phone] or [phone]       Fax: [phone]   RxID:   [id number] VIAGRA 100 MG TABS (SILDENAFIL CITRATE) UAD  #6 x 11   Entered and Authorized by:   Roderick Pee MD   Signed by:   Roderick Pee MD on 04/11/2010   Method used:   Electronically to        [facility]  [phone]* (retail)       [address]       [place], [place]  [address]       Ph: [phone] or [phone]       Fax: [phone]   RxID:   [id number] FUROSEMIDE 40 MG  TABS (FUROSEMIDE) take one tab two times a day  #200 x 3   Entered and Authorized by:   Roderick Pee MD   Signed by:   Roderick Pee MD on 04/11/2010   Method used:  Electronically to        [facility]  [phone]* (retail)       [address]       [place], [place]  [address]       Ph: [phone] or [phone]       Fax: [phone]   RxID:   [id number] CLONIDINE HCL 0.1 MG  TABS (CLONIDINE HCL) take one tab two times a day  #200 x 3   Entered and Authorized by:   Roderick Pee MD   Signed by:   Roderick Pee MD on 04/11/2010   Method used:   Electronically to        [facility]  [phone]* (retail)       [address]       [place], [place]  [address]       Ph: [phone] or [phone]       Fax: [phone]   RxID:   [id number] NORVASC 10 MG  TABS (AMLODIPINE BESYLATE) take one tab once daily  #100 x 3   Entered and Authorized by:   Roderick Pee MD   Signed by:   Roderick Pee MD on 04/11/2010   Method used:   Electronically to         [facility]  [phone]* (retail)       [address]       [place], [place]  [address]       Ph: [phone] or [phone]       Fax: [phone]   RxID:   [id number] LABETALOL HCL 200 MG  TABS (LABETALOL HCL) 2 in am, 1 in pm  #300 x 3   Entered and Authorized by:   Roderick Pee MD   Signed by:   Roderick Pee MD on 04/11/2010   Method used:   Electronically to        [facility]  [phone]* (retail)       [address]       [place], [place]  [address]       Ph: [phone] or [phone]       Fax: [phone]   RxID:   [id number]    Immunizations Administered:  Influenza Vaccine # 1:    Vaccine Type: Fluvax 3+    Site: left deltoid    Mfr: GlaxoSmithKline    Dose: 0.5 ml    Route: IM    Given by: Kathrynn Speed CMA    Exp. Date: 01/17/2011    Lot #: [id number]    VIS given: 02/11/10 version given April 11, 2010.  Flu Vaccine Consent Questions:    Do you have a history of severe allergic reactions to this vaccine? no    Any prior history of allergic reactions to egg and/or gelatin? no    Do you have a sensitivity to the preservative Thimersol? no    Do you have a past history of Guillan-Barre Syndrome? no    Do you currently have an acute febrile illness? no    Have you ever had a severe reaction to latex? no    Vaccine information given and explained to patient? no    Laboratory Results   Urine Tests  Date/Time Recieved: April 11, 2010 12:46 PM  Date/Time Reported: April 11, 2010 12:46 PM   Routine Urinalysis   Color: yellow Appearance: Clear Glucose: negative   (Normal Range: Negative) Bilirubin: 1+   (Normal Range: Negative) Ketone: trace (5)   (Normal Range: Negative) Spec. Gravity: 1.025   (Normal Range: 1.003-1.035) Blood: negative   (Normal Range: Negative) pH: 6.5   (Normal Range: 5.0-8.0) Protein: 1+   (Normal Range: Negative) Urobilinogen: 1.0    (Normal Range: 0-1) Nitrite: negative   (Normal Range: Negative) Leukocyte Esterace: negative   (Normal Range: Negative)    Comments: Wynona Canes, CMA  April 11, 2010 12:46 PM

## 2010-08-19 NOTE — Progress Notes (Signed)
Summary: refill  Phone Note Refill Request Message from:  Fax from Pharmacy on May 08, 2010 7:52 AM  Refills Requested: Medication #1:  NORVASC 10 MG  TABS take one tab once daily  Medication #2:  ZOCOR 40 MG  TABS take one tab once daily Initial call taken by: Kern Reap CMA Duncan Dull),  May 08, 2010 7:52 AM    Prescriptions: ZOCOR 40 MG  TABS (SIMVASTATIN) take one tab once daily  #100 x 3   Entered by:   Kern Reap CMA (AAMA)   Authorized by:   Roderick Pee MD   Signed by:   Kern Reap CMA (AAMA) on 05/08/2010   Method used:   Electronically to        [facility]  [phone]* (retail)       [address]       [place], [place]  [address]       Ph: [phone] or [phone]       Fax: [phone]   RxID:   [id number] NORVASC 10 MG  TABS (AMLODIPINE BESYLATE) take one tab once daily  #100 x 3   Entered by:   Kern Reap CMA (AAMA)   Authorized by:   Roderick Pee MD   Signed by:   Kern Reap CMA (AAMA) on 05/08/2010   Method used:   Electronically to        [facility]  [phone]* (retail)       [address]       [place], [place]  [address]       Ph: [phone] or [phone]       Fax: [phone]   RxID:   [id number]

## 2010-08-19 NOTE — Progress Notes (Signed)
Summary: clonidine refill  Phone Note Refill Request Message from:  Fax from Pharmacy on May 12, 2010 4:58 PM  Refills Requested: Medication #1:  CLONIDINE HCL 0.1 MG  TABS take one tab two times a day Initial call taken by: Kern Reap CMA Duncan Dull),  May 12, 2010 4:58 PM    Prescriptions: CLONIDINE HCL 0.1 MG  TABS (CLONIDINE HCL) take one tab two times a day  #200 x 3   Entered by:   Kern Reap CMA (AAMA)   Authorized by:   Roderick Pee MD   Signed by:   Kern Reap CMA (AAMA) on 05/12/2010   Method used:   Electronically to        [facility]  [address] (retail)       [address]       [place], [place]  [address]       Ph: [phone]       Fax: [phone]   RxID:   [id number]

## 2010-12-05 NOTE — Op Note (Signed)
[facility]  Patient:    Daniel Cochran, Daniel Cochran                       MRN: [id number] Proc. Date: 09/25/99 Attending:  Hewitt Shorts, M.D.                           Operative Report  PREOPERATIVE DIAGNOSIS:  Right V2 and V3 trigeminal neuralgia.  POSTOPERATIVE DIAGNOSIS:  Right V2 and V3 trigeminal neuralgia.  PROCEDURE:  Right retromastoid craniectomy and microvascular decompression of the right trigeminal nerve.  SURGEON:  Hewitt Shorts, M.D.  ASSISTANT:  Alanson Aly. Roxan Hockey, M.D.  ANESTHESIA:  General endotracheal anesthesia.  INDICATIONS:  This is a 70 year old male who presented with right V2 and V3 trigeminal neuralgia with intractable pain despite maximal medical therapy and ith associated toxicity from maximal medical therapy.  The decision was made to proceed with microvascular decompression of the trigeminal nerve.  PROCEDURE:  The patient was brought to the operating room and placed under general endotracheal anesthesia.  The patient was turned to a park-bench position.  A three-pin Mayfield headholder was applied and the body including the extremities and axilla were padded and supported as necessary.  The right retromastoid area was shaved and prepped with Betadine soap and solution and draped in the sterile fashion.  The line of the incision was infiltrated with local anesthetic with epinephrine and a vertical 8 cm in length retromastoid incision was made and carried down to the subcutaneous tissue.  Bipolar electrocautery was used to maintain hemostasis.  The nuchal muscle was incised and dissection was carried own to the occipital bone.  Self-retaining retractor was placed and then using the Midas Rex drill with an M3 bur, a retromastoid craniectomy was performed.  It was gradually extended superolaterally and we were able to identify the transverse sigmoid sinuses.  The remaining thin eggshell of bone was removed  carefully and  then the dura was opened with triangular flaps hinged towards the transverse and sigmoid sinuses, respectively.  The buddy halo self-retaining retractor system as assembled and cottonoid patty was placed over the cerebellar hemisphere. Cerebrospinal fluid was gently aspirated and self-retaining retractor placed. e were able to dissect down along the superolateral corner of the right side of the posterior fossa.  We were able to dissect carefully down towards the trigeminal  nerve.  The microscope was draped in the border of the field to provide for magnification, illumination and visualization and the remainder of the procedure was performed using microdissection technique.  Dissection was carried down to he trigeminal nerve which was identified.  As we freed up the arachnoid adhesions around the nerve, two large loops of artery were found to compress the nerve along its superior aspect extending from proximal to distal along the nerve.  The arachnoid adhesions holding the artery against the nerve were freed up and we were able to mobilize the arteries and then place small pledgets of Teflon felt between the artery and nerve.  The nerve was thereby decompressed.  Once the decompression was completed, the operative site was filled with saline.  Good hemostasis was noted and we proceeded with closure.  The self-retaining retractor was removed.  The cottonoid patty was gently elevated.  Hemostasis was again checked for and then the dura was closed with interrupted #4-0 Nurulon sutures.  We did use a patch of DuraGuard (bovine pericardium) to patch a small  1 x 2 cm defect.  Surgicel and Gelfoam were placed in the cranial defect.  Then, a flap of ______ was cut and shaped to size of the skull defect.  It was secured to the skull with four ______ tacks to recontour the skull defect.  The wound was irrigated with Bacitracin and checked for hemostasis which was  established and confirmed.  We proceeded with closure.  The deep muscles were reapproximated with interrupted undyed 0 Vicryl  sutures.  The deep fascia closed with interrupted inverted undyed 0 Vicryl sutures, the subcutaneous and subcuticular were closed with interrupted inverted undyed  Vicryl sutures, the subcutaneous and subcuticular closed with interrupted inverted #2-0 undyed Vicryl sutures and the skin edges were reapproximated with surgical  staples. The wound was dressed with Adaptic and sterile gauze.  The patient tolerated the procedure well.  The estimated blood loss was 100 cc.   Sponge, instrument and needle counts were correct.  Following surgery, the patient was turned back to the supine position and the ______ mesh fit head holder was removed.  He was reversed from the anesthetic, extubated and transferred to the recovery room for further care. DD:  09/25/99 TD:  09/26/99 Job: [id number]

## 2010-12-05 NOTE — Op Note (Signed)
NAME:  MURPHY, DUZAN NO.:  [id number]   MEDICAL RECORD NO.:  [id number]                   PATIENT TYPE:  AMB   LOCATION:  [facility]                                 FACILITY:  [facility]   PHYSICIAN:  Lindaann Slough, M.D.               DATE OF BIRTH:  24-Nov-1940   DATE OF PROCEDURE:  07/31/2003  DATE OF DISCHARGE:                                 OPERATIVE REPORT   PREOPERATIVE DIAGNOSIS:  Right ureteral stone.   POSTOPERATIVE DIAGNOSIS:  Right ureteral stone.   PROCEDURE:  Cystoscopy, right retrograde pyelogram, ureteroscopy with  holmium laser right ureteral stone extraction and insertion of double J  catheter.   SURGEON:  Lindaann Slough, M.D.   ANESTHESIA:  General.   INDICATIONS FOR PROCEDURE:  The patient is a 70 year old male who was seen  in the emergency room about a month ago for abdominal pain and fever.  A CT  scan of the abdomen and pelvis showed moderate right hydronephrosis and 7 x  6 mm stone in the right distal ureter.  He has not passed the stone.  KUB  done this morning showed the stone in the same location. He is scheduled  today for stone manipulation.   DESCRIPTION OF PROCEDURE:  Under general anesthesia, the patient was prepped  and draped and placed in the dorsal lithotomy position.  A #25 Wappler  cystoscope was inserted in the bladder.  The patient had moderate prostatic  hypertrophy. The bladder is moderately trabeculated. There is no stone or  tumor in the bladder. The ureteral orifices are in normal position and  shape. A cone tip catheter was then passed through the cystoscope into the  right ureteral orifice, contrast was then injected through the cone tip  catheter and there is a filling defect in the distal ureter and the proximal  ureter is moderately dilated. The cone tip catheter was then removed.  A  Glidewire was passed through an open end catheter into the ureter.  The open  end catheter was then removed and the  cystoscope was removed. A #6.5 French  rigid ureteroscope was then passed in the bladder and into the ureter.  The  stone in visualized in the distal ureter and was then fragmented with the  holmium laser in multiple small fragments.  The stone fragments were then  removed with the nitinol stone basket.  The ureteroscope was then reinserted  in the ureter.  The contrast was then injected through the ureteroscope and  there was no evidence of extravasation of contrast.  The ureteroscope was  then removed.  The Glidewire was then back loaded into the cystoscope and a  6 [place] - 26 double J catheter was passed over the Glidewire.  The proximal  curl of the double J catheter is in the collecting system. The distal curl  is in the bladder.   The bladder was then emptied and the  cystoscope and Glidewire were removed.   The patient tolerated the procedure well and left the OR in satisfactory  condition to the post anesthesia care unit.                                               Lindaann Slough, M.D.   MN/MEDQ  D:  07/31/2003  T:  07/31/2003  Job:  [id number]

## 2010-12-05 NOTE — H&P (Signed)
[name]  Patient:    Daniel Cochran, Daniel Cochran                       MRN: [id number] Adm. Date:  [id number] Attending:  Barton Fanny CC:         Hewitt Shorts, M.D.                         History and Physical  HISTORY OF PRESENT ILLNESS:   Patient is a 70 year old right-handed white male ho was evaluated for a right V2 and V3 trigeminal neuralgia.  His symptoms began over three years ago.  Initially, he would awaken in the early morning with a tingling and electric-like feeling in the right upper lip, nasolabial fold and adjacent teeth.  He saw his dentist and was started on Tegretol and referred to an endodontist, no significant pathology was found, and he was diagnosed with facial neuralgia.  Since that time, he has undergone two neurosurgical consultations and has been under neurologic care.  An MRI scan a year ago was unremarkable.  He has been treated with Tegretol, Neurontin and Trileptal without relief.  Overall, in fact, he feels that the pain has been gradually worsening and at times has been  exceedingly severe.  At one point, he lost 17 pounds because the pain was too severe to eat.  He finds the paroxysms of pain can occur with opening his mouth and eating, brushing his teeth, talking, shaving or showering.  The pain is both in the V2 as well as the V3 distribution of the right side of his face, involving the right cheek and gums as well as the lateral aspect of his jaw.  He describes the pain as occasionally electric-like and at other times, like a hot needle and at  other times, he has a deep aching.  PAST MEDICAL HISTORY:  Notable for a history of hypertension, which has been increasingly difficult to manage.  He is currently on four agents.  He suspects  that much of the hypertension is due to his severe facial pain.  He has no history though of myocardial infarction, cancer, stroke, diabetes, peptic ulcer  disease or lung disease.  PAST SURGICAL HISTORY:  He has had no previous surgeries.  ALLERGIES:  No known allergies.  MEDICATIONS: 1. Hydralazine 50 mg t.i.d. for hypertension. 2. Labetalol 200 mg b.i.d. for hypertension. 3. Accupril 20 mg b.i.d. for hypertension. 4. Lasix 20 mg t.i.d. for hypertension. 5. Neurontin 300 mg b.i.d. for trigeminal neuralgia. 6. Trileptal 600 mg b.i.d. for trigeminal neuralgia. 7. Tegretol 200 mg b.i.d. for trigeminal neuralgia.  FAMILY HISTORY:  His parents both died at age 35.  His mother had diabetes and heart disease and died of a heart attack.  His father had prostate cancer.  SOCIAL HISTORY:  Patient is married.  He works as a Chartered certified accountant.  He does not smoke. He drinks alcoholic beverages socially.  He denies a history of substance abuse.  REVIEW OF SYSTEMS:  Notable for those difficulties described in his history of present illness and past medical history but is otherwise unremarkable.  PHYSICAL EXAMINATION:  GENERAL:  Patient is a well-developed, well-nourished white male in no acute distress.  VITAL SIGNS:  Temperature 97.8, pulse 60, blood pressure 140/80, respiratory rate 20.  Height 6 feet.  Weight 198 pounds.  LUNGS:  Clear to auscultation.  He has symmetrical respiratory excursion.  HEART:  Regular  rate and rhythm.  Normal S1 and S2.  There is no murmur.  ABDOMEN:  Soft, nondistended.  Bowel sounds are present.  EXTREMITIES:  Examination shows no clubbing, cyanosis, or edema.  NEUROLOGIC:  Examination shows on mental status, patient is awake, alert and fully oriented.  His speech is fluent.  He has good comprehension.  Cranial nerves show his pupils to be equal, round and reactive to light and about 3 mm bilaterally.  Extraocular movements are intact.  Facial sensation is intact, both to light touch and pinprick.  Facial movement is essentially symmetrical, although he hesitates to move the right nasolabial fold as  vigorously as that on the left due to the tendency to precipitate paroxysms of trigeminal neuralgic pain.  Hearing is intact bilaterally.  Palatal movement is symmetrical.  Shoulder shrug is symmetrical. The tongue is midline.  Motor examination shows good strength.  He has a normal gait.  DIAGNOSTIC STUDIES:  MRI of the brain done last year at [facility] shows no significant pathology in the cerebellopontine angle.  IMPRESSION:  Right V2 and V3 trigeminal neuralgia, intractable to extensive medical therapy and, in fact, the patient has been having mild toxicity from the amount of medication he is using, with a tendency towards sleepiness, fatigue and so on.  PLAN:  The patient will be admitted for a right retromastoid microvascular decompression of the trigeminal nerve.  We have discussed alternatives to surgery, the nature of the surgical procedure itself, typical length of surgery, hospital stay and overall recuperation and risks of surgery including the risks of infection, bleeding, possible need for transfusion; the risk of brain stem dysfunction with stroke, including paralysis, coma, difficulty swallowing, loss of hearing, impaired facial movement or eye movements and so on.  We also discussed the possibility of residual neuralgic pain and anesthetic risks of myocardial infarction, stroke, pneumonia and death.  We also discussed the risk of CSF leak. After discussing this thoroughly, the patient does wish to proceed with surgery and is admitted for such. DD:  09/25/99 TD:  09/25/99 Job: [id number]

## 2010-12-05 NOTE — Discharge Summary (Signed)
[place]  Patient:    Daniel Cochran, Daniel Cochran                       MRN: [id number] Adm. Date:  [id number] Disc. Date: [id number] Attending:  Barton Fanny CC:         Hewitt Shorts, M.D. at office.                           Discharge Summary  ADMISSION HISTORY AND PHYSICAL EXAMINATION:  The patient is a 70 year old man who presented with a right V2 and V3 trigeminal neuralgia.  He had been receiving tremendous amounts of medical treatment without relief, and despite three medications and sedation from those medications he was still having significant neuralgic pain.  Therefore, the patient was evaluated for microvascular decompression, and admitted for such.  General examination was unremarkable, other than for hypertension which required treatment with four agents.  Neurologic examination was essentially intact.  HOSPITAL COURSE:  The patient was admitted and underwent a right retromastoid craniectomy and microvascular decompression of the right trigeminal nerve.  He did well following surgery, with good relief of his facial pain.  All three medications, including Neurontin, Trilaptal, and Tegretol were discontinued at the time of surgery.  Following surgery, he has been up and ambulating.  His wound is healing well.  He is afebrile.  DISPOSITION:  He is being discharged to home with instructions regarding wound care and activities.  FOLLOW-UP:  He is to return to my office in four to seven days for staple removal.  MEDICATIONS:  He has been given a prescription for Vicodin 1-2 tablets p.o. q.i.d. p.r.n. pain, 30 tablets prescribed, no refills.  DISCHARGE DIAGNOSES:  Trigeminal neuralgia.DD:  09/29/99 TD:  09/29/99 Job: [id number]

## 2011-04-13 ENCOUNTER — Other Ambulatory Visit: Payer: Self-pay | Admitting: Family Medicine

## 2011-04-27 ENCOUNTER — Other Ambulatory Visit: Payer: Self-pay | Admitting: Family Medicine

## 2011-04-29 ENCOUNTER — Other Ambulatory Visit: Payer: Self-pay

## 2011-04-29 MED ORDER — FUROSEMIDE 40 MG PO TABS: 40.0000 mg | ORAL_TABLET | Freq: Two times a day (BID) | ORAL | Status: DC

## 2011-04-29 NOTE — Telephone Encounter (Signed)
rx sent to pharmacy

## 2011-05-28 ENCOUNTER — Other Ambulatory Visit: Payer: Self-pay | Admitting: Family Medicine

## 2011-06-22 ENCOUNTER — Other Ambulatory Visit: Payer: Self-pay | Admitting: Family Medicine

## 2011-07-09 ENCOUNTER — Ambulatory Visit (INDEPENDENT_AMBULATORY_CARE_PROVIDER_SITE_OTHER): Payer: Medicare Other | Admitting: Family Medicine

## 2011-07-09 ENCOUNTER — Encounter: Payer: Self-pay | Admitting: Family Medicine

## 2011-07-09 DIAGNOSIS — I10 Essential (primary) hypertension: Secondary | ICD-10-CM

## 2011-07-09 DIAGNOSIS — Z125 Encounter for screening for malignant neoplasm of prostate: Secondary | ICD-10-CM

## 2011-07-09 DIAGNOSIS — E785 Hyperlipidemia, unspecified: Secondary | ICD-10-CM

## 2011-07-09 DIAGNOSIS — F528 Other sexual dysfunction not due to a substance or known physiological condition: Secondary | ICD-10-CM

## 2011-07-09 DIAGNOSIS — Z0000 Encounter for general adult medical examination without abnormal findings: Secondary | ICD-10-CM

## 2011-07-09 DIAGNOSIS — Z23 Encounter for immunization: Secondary | ICD-10-CM

## 2011-07-09 LAB — CBC WITH DIFFERENTIAL/PLATELET
Basophils Absolute: 0 10*3/uL (ref 0.0–0.1)
Basophils Relative: 0.4 % (ref 0.0–3.0)
Eosinophils Absolute: 0.1 10*3/uL (ref 0.0–0.7)
Eosinophils Relative: 1.2 % (ref 0.0–5.0)
HCT: 45.3 % (ref 39.0–52.0)
Hemoglobin: 15.6 g/dL (ref 13.0–17.0)
Lymphocytes Relative: 26.7 % (ref 12.0–46.0)
Lymphs Abs: 2 10*3/uL (ref 0.7–4.0)
MCHC: 34.3 g/dL (ref 30.0–36.0)
MCV: 93.8 fl (ref 78.0–100.0)
Monocytes Absolute: 0.4 10*3/uL (ref 0.1–1.0)
Monocytes Relative: 5.9 % (ref 3.0–12.0)
Neutro Abs: 4.9 10*3/uL (ref 1.4–7.7)
Neutrophils Relative %: 65.8 % (ref 43.0–77.0)
Platelets: 279 10*3/uL (ref 150.0–400.0)
RBC: 4.83 Mil/uL (ref 4.22–5.81)
RDW: 12.9 % (ref 11.5–14.6)
WBC: 7.5 10*3/uL (ref 4.5–10.5)

## 2011-07-09 LAB — HEPATIC FUNCTION PANEL
ALT: 34 U/L (ref 0–53)
AST: 43 U/L — ABNORMAL HIGH (ref 0–37)
Albumin: 4.5 g/dL (ref 3.5–5.2)
Alkaline Phosphatase: 83 U/L (ref 39–117)
Bilirubin, Direct: 0.2 mg/dL (ref 0.0–0.3)
Total Bilirubin: 0.9 mg/dL (ref 0.3–1.2)
Total Protein: 7 g/dL (ref 6.0–8.3)

## 2011-07-09 LAB — BASIC METABOLIC PANEL
BUN: 16 mg/dL (ref 6–23)
CO2: 30 mEq/L (ref 19–32)
Calcium: 9.4 mg/dL (ref 8.4–10.5)
Chloride: 103 mEq/L (ref 96–112)
Creatinine, Ser: 1.1 mg/dL (ref 0.4–1.5)
GFR: 73.24 mL/min (ref >60.00)
Glucose, Bld: 164 mg/dL — ABNORMAL HIGH (ref 70–99)
Potassium: 4.5 mEq/L (ref 3.5–5.1)
Sodium: 142 mEq/L (ref 135–145)

## 2011-07-09 LAB — POCT URINALYSIS DIPSTICK
Blood, UA: NEGATIVE
Glucose, UA: NEGATIVE
Ketones, UA: NEGATIVE
Leukocytes, UA: NEGATIVE
Nitrite, UA: NEGATIVE
Spec Grav, UA: 1.02
Urobilinogen, UA: 1
pH, UA: 7

## 2011-07-09 LAB — LIPID PANEL
Cholesterol: 142 mg/dL (ref 0–200)
HDL: 39 mg/dL — ABNORMAL LOW (ref >39.00)
LDL Cholesterol: 90 mg/dL (ref 0–99)
Total CHOL/HDL Ratio: 4
Triglycerides: 65 mg/dL (ref 0.0–149.0)
VLDL: 13 mg/dL (ref 0.0–40.0)

## 2011-07-09 LAB — PSA: PSA: 1.59 ng/mL (ref 0.10–4.00)

## 2011-07-09 LAB — TSH: TSH: 1.18 u[IU]/mL (ref 0.35–5.50)

## 2011-07-09 MED ORDER — SIMVASTATIN 40 MG PO TABS: 40.0000 mg | ORAL_TABLET | Freq: Every day | ORAL | Status: DC

## 2011-07-09 MED ORDER — SILDENAFIL CITRATE 100 MG PO TABS: 50.0000 mg | ORAL_TABLET | Freq: Every day | ORAL | Status: DC | PRN

## 2011-07-09 MED ORDER — CLONIDINE HCL 0.1 MG PO TABS: 0.1000 mg | ORAL_TABLET | Freq: Two times a day (BID) | ORAL | Status: DC

## 2011-07-09 MED ORDER — AMLODIPINE BESYLATE 10 MG PO TABS: 10.0000 mg | ORAL_TABLET | Freq: Every day | ORAL | Status: DC

## 2011-07-09 MED ORDER — LABETALOL HCL 200 MG PO TABS: 200.0000 mg | ORAL_TABLET | Freq: Two times a day (BID) | ORAL | Status: DC

## 2011-07-09 MED ORDER — FUROSEMIDE 40 MG PO TABS: 40.0000 mg | ORAL_TABLET | Freq: Two times a day (BID) | ORAL | Status: DC

## 2011-07-09 NOTE — Progress Notes (Signed)
  Subjective:    Patient ID: Daniel Cochran, male    DOB: Dec 31, 1940, 70 y.o.   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  There comes in today for evaluation of hypertension, hyperlipidemia, and erectile dysfunction.  His medications were reviewed in detail see the medicine list and is accurate.  His blood pressure is 150/90.  Here today he states his blood pressure home is lower.  He gets routine eye care, hearing normal, regular dental care, vaccinations up to date except he needs a tetanus booster and a flu shot and Pneumovax 2008, shingles 2009, cognitive function, normal.  He still works on his farm.  Home health safety reviewed.  No issues identified.  He walks on a regular basis.  He does have guns in the house, but their locked up.  He does have a healthcare power of attorney and living will.   Review of Systems  Constitutional: Negative.   HENT: Negative.   Eyes: Negative.   Respiratory: Negative.   Cardiovascular: Negative.   Gastrointestinal: Negative.   Genitourinary: Negative.   Musculoskeletal: Negative.   Skin: Negative.   Neurological: Negative.   Hematological: Negative.   Psychiatric/Behavioral: Negative.        Objective:   Physical Exam  Constitutional: He is oriented to person, place, and time. He appears well-developed and well-nourished.  HENT:  Head: Normocephalic and atraumatic.  Right Ear: External ear normal.  Left Ear: External ear normal.  Nose: Nose normal.  Mouth/Throat: Oropharynx is clear and moist.  Eyes: Conjunctivae and EOM are normal. Pupils are equal, round, and reactive to light.  Neck: Normal range of motion. Neck supple. No JVD present. No tracheal deviation present. No thyromegaly present.  Cardiovascular: Normal rate, regular rhythm, normal heart sounds and intact distal pulses.  Exam reveals no gallop and no friction rub.   No murmur heard. Pulmonary/Chest: Effort normal and breath sounds normal. No  stridor. No respiratory distress. He has no wheezes. He has no rales. He exhibits no tenderness.  Abdominal: Soft. Bowel sounds are normal. He exhibits no distension and no mass. There is no tenderness. There is no rebound and no guarding.  Genitourinary: Rectum normal, prostate normal and penis normal. Guaiac negative stool. No penile tenderness.  Musculoskeletal: Normal range of motion. He exhibits no edema and no tenderness.  Lymphadenopathy:    He has no cervical adenopathy.  Neurological: He is alert and oriented to person, place, and time. He has normal reflexes. No cranial nerve deficit. He exhibits normal muscle tone.  Skin: Skin is warm and dry. No rash noted. No erythema. No pallor.  Psychiatric: He has a normal mood and affect. His behavior is normal. Judgment and thought content normal.          Assessment & Plan:  Healthy male.  Hypertension, and goal continue current therapy.  Hyperlipidemia.  Continue current therapy.  Erectile dysfunction.  Continue current therapy.  Seasonal flu shot today.  Tetanus booster

## 2011-07-09 NOTE — Patient Instructions (Signed)
Continue your current medications.  Remember to check your blood pressure weekly.  Return in one year or sooner if any problem

## 2011-07-10 NOTE — Progress Notes (Signed)
Quick Note:  Pt aware ______ 

## 2011-09-10 ENCOUNTER — Ambulatory Visit (INDEPENDENT_AMBULATORY_CARE_PROVIDER_SITE_OTHER): Payer: Medicare Other | Admitting: Family Medicine

## 2011-09-10 ENCOUNTER — Encounter: Payer: Self-pay | Admitting: Family Medicine

## 2011-09-10 DIAGNOSIS — I10 Essential (primary) hypertension: Secondary | ICD-10-CM

## 2011-09-10 DIAGNOSIS — R739 Hyperglycemia, unspecified: Secondary | ICD-10-CM

## 2011-09-10 DIAGNOSIS — R7309 Other abnormal glucose: Secondary | ICD-10-CM

## 2011-09-10 NOTE — Progress Notes (Signed)
  Subjective:    Patient ID: Daniel Cochran, male    DOB: 11/25/1940, 71 y.o.   MRN: [id number]  HPI Daniel Cochran is a 71 year old male who comes in today for evaluation of 2 problems  His blood pressure at home averages 140/80 when he comes here he gets nervous and his blood pressure goes up. Today it's 190/100.  He monitors his blood pressure daily at home.  He's been compliant with his medication  His fasting blood sugar has always been in the 1 04/20/2019 range now is 164. He states he drinks sodas on a daily basis and that his mother was a diabetic. 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 No symptoms of diabetes   Review of Systems  Review of systems cardiovascular review of systems metabolic review of systems otherwise negative     Objective:   Physical Exam Well-developed well-nourished male in no acute distress BP right arm sitting position 190/100 however blood pressure at home this morning was 140/80       Assessment & Plan:  Hypertension at goal with normal home blood pressures continue current therapy  Glucose intolerance plan at [facility] sugar followup blood sugar and A1c in 3 months

## 2011-09-10 NOTE — Patient Instructions (Signed)
Continue your current medications for your blood pressure  Illuminate the sugar from your diet  Continue your good exercise program  Followup in 3 months  Nonfasting labs one week prior

## 2011-11-30 ENCOUNTER — Other Ambulatory Visit (INDEPENDENT_AMBULATORY_CARE_PROVIDER_SITE_OTHER): Payer: Medicare Other

## 2011-11-30 DIAGNOSIS — R7309 Other abnormal glucose: Secondary | ICD-10-CM

## 2011-11-30 DIAGNOSIS — R739 Hyperglycemia, unspecified: Secondary | ICD-10-CM

## 2011-11-30 LAB — BASIC METABOLIC PANEL
BUN: 21 mg/dL (ref 6–23)
CO2: 27 mEq/L (ref 19–32)
Calcium: 8.9 mg/dL (ref 8.4–10.5)
Chloride: 97 mEq/L (ref 96–112)
Creatinine, Ser: 1.1 mg/dL (ref 0.4–1.5)
GFR: 68.65 mL/min (ref >60.00)
Glucose, Bld: 113 mg/dL — ABNORMAL HIGH (ref 70–99)
Potassium: 3.5 mEq/L (ref 3.5–5.1)
Sodium: 139 mEq/L (ref 135–145)

## 2011-11-30 LAB — MICROALBUMIN / CREATININE URINE RATIO
Creatinine,U: 234 mg/dL
Microalb Creat Ratio: 0.6 mg/g (ref 0.0–30.0)
Microalb, Ur: 1.3 mg/dL (ref 0.0–1.9)

## 2011-11-30 LAB — HEMOGLOBIN A1C: Hgb A1c MFr Bld: 6.7 % — ABNORMAL HIGH (ref 4.6–6.5)

## 2011-12-07 ENCOUNTER — Encounter: Payer: Self-pay | Admitting: Family Medicine

## 2011-12-07 ENCOUNTER — Ambulatory Visit (INDEPENDENT_AMBULATORY_CARE_PROVIDER_SITE_OTHER): Payer: Medicare Other | Admitting: Family Medicine

## 2011-12-07 VITALS — BP 160/90 | Temp 98.2°F | Wt 200.0 lb

## 2011-12-07 DIAGNOSIS — I10 Essential (primary) hypertension: Secondary | ICD-10-CM

## 2011-12-07 DIAGNOSIS — R739 Hyperglycemia, unspecified: Secondary | ICD-10-CM

## 2011-12-07 DIAGNOSIS — R7309 Other abnormal glucose: Secondary | ICD-10-CM

## 2011-12-07 NOTE — Patient Instructions (Signed)
Continue your current medication  Check a fasting blood sugar weekly  Followup December for your annual exam sooner if any problems

## 2011-12-07 NOTE — Progress Notes (Signed)
  Subjective:    Patient ID: Daniel Cochran, male    DOB: [birth year], 71 y.o.   MRN: [id number]  HPI Daniel Cochran is a 71 year old male who comes in today for followup of hypertension and diabetes  He's been compliant with his medication BP at home runs around 140/84.  He's been following diet and exercise because he had some glucose elevation blood sugar today 113 A1c 6.7.  Random blood sugar 1 RPC 211    Review of Systems General and metabolic review of systems otherwise negative    Objective:   Physical Exam Well-developed well-nourished male in no acute distress       Assessment & Plan:  Hypertension at goal continue current therapy #2 glucose intolerance continue diet exercise followup in 12 2013 sooner if any problems fasting blood sugar weekly at home

## 2012-07-04 ENCOUNTER — Other Ambulatory Visit: Payer: Medicare Other

## 2012-07-11 ENCOUNTER — Encounter: Payer: Medicare Other | Admitting: Family Medicine

## 2012-07-18 ENCOUNTER — Other Ambulatory Visit: Payer: Self-pay | Admitting: Family Medicine

## 2012-08-17 ENCOUNTER — Other Ambulatory Visit (INDEPENDENT_AMBULATORY_CARE_PROVIDER_SITE_OTHER): Payer: Medicare Other

## 2012-08-17 DIAGNOSIS — E785 Hyperlipidemia, unspecified: Secondary | ICD-10-CM

## 2012-08-17 DIAGNOSIS — Z0000 Encounter for general adult medical examination without abnormal findings: Secondary | ICD-10-CM

## 2012-08-17 DIAGNOSIS — I10 Essential (primary) hypertension: Secondary | ICD-10-CM

## 2012-08-17 LAB — LIPID PANEL
Cholesterol: 119 mg/dL (ref 0–200)
HDL: 38.3 mg/dL — ABNORMAL LOW (ref >39.00)
Triglycerides: 69 mg/dL (ref 0.0–149.0)
VLDL: 13.8 mg/dL (ref 0.0–40.0)

## 2012-08-17 LAB — CBC WITH DIFFERENTIAL/PLATELET
Basophils Relative: 0.3 % (ref 0.0–3.0)
Eosinophils Absolute: 0.1 10*3/uL (ref 0.0–0.7)
Eosinophils Relative: 1.2 % (ref 0.0–5.0)
Lymphocytes Relative: 25.1 % (ref 12.0–46.0)
MCHC: 33.5 g/dL (ref 30.0–36.0)
Neutrophils Relative %: 67.1 % (ref 43.0–77.0)
Platelets: 279 10*3/uL (ref 150.0–400.0)
RBC: 5.07 Mil/uL (ref 4.22–5.81)
WBC: 9.5 10*3/uL (ref 4.5–10.5)

## 2012-08-17 LAB — BASIC METABOLIC PANEL
BUN: 15 mg/dL (ref 6–23)
CO2: 31 mEq/L (ref 19–32)
Chloride: 100 mEq/L (ref 96–112)
Creatinine, Ser: 1.1 mg/dL (ref 0.4–1.5)

## 2012-08-17 LAB — POCT URINALYSIS DIPSTICK
Glucose, UA: NEGATIVE
Leukocytes, UA: NEGATIVE
Nitrite, UA: NEGATIVE
pH, UA: 6

## 2012-08-17 LAB — HEPATIC FUNCTION PANEL
Alkaline Phosphatase: 74 U/L (ref 39–117)
Bilirubin, Direct: 0.1 mg/dL (ref 0.0–0.3)
Total Bilirubin: 1 mg/dL (ref 0.3–1.2)
Total Protein: 7.3 g/dL (ref 6.0–8.3)

## 2012-08-25 ENCOUNTER — Other Ambulatory Visit: Payer: Self-pay | Admitting: Family Medicine

## 2012-08-25 ENCOUNTER — Encounter: Payer: Medicare Other | Admitting: Family Medicine

## 2012-09-01 ENCOUNTER — Encounter: Payer: Medicare Other | Admitting: Family Medicine

## 2012-09-08 ENCOUNTER — Ambulatory Visit (INDEPENDENT_AMBULATORY_CARE_PROVIDER_SITE_OTHER): Payer: Medicare Other | Admitting: Family Medicine

## 2012-09-08 ENCOUNTER — Encounter: Payer: Self-pay | Admitting: Family Medicine

## 2012-09-08 VITALS — BP 130/80 | Temp 98.5°F | Ht 72.25 in | Wt 198.0 lb

## 2012-09-08 DIAGNOSIS — R739 Hyperglycemia, unspecified: Secondary | ICD-10-CM

## 2012-09-08 DIAGNOSIS — E785 Hyperlipidemia, unspecified: Secondary | ICD-10-CM

## 2012-09-08 DIAGNOSIS — I10 Essential (primary) hypertension: Secondary | ICD-10-CM

## 2012-09-08 DIAGNOSIS — F528 Other sexual dysfunction not due to a substance or known physiological condition: Secondary | ICD-10-CM

## 2012-09-08 MED ORDER — CLONIDINE HCL 0.1 MG PO TABS: 0.1000 mg | ORAL_TABLET | Freq: Two times a day (BID) | ORAL | Status: DC

## 2012-09-08 MED ORDER — AMLODIPINE BESYLATE 10 MG PO TABS: ORAL_TABLET | ORAL | Status: DC

## 2012-09-08 MED ORDER — FUROSEMIDE 40 MG PO TABS: 40.0000 mg | ORAL_TABLET | Freq: Two times a day (BID) | ORAL | Status: DC

## 2012-09-08 MED ORDER — SILDENAFIL CITRATE 100 MG PO TABS: 50.0000 mg | ORAL_TABLET | Freq: Every day | ORAL | Status: DC | PRN

## 2012-09-08 MED ORDER — LABETALOL HCL 200 MG PO TABS: ORAL_TABLET | ORAL | Status: DC

## 2012-09-08 MED ORDER — SIMVASTATIN 40 MG PO TABS: ORAL_TABLET | ORAL | Status: DC

## 2012-09-08 NOTE — Patient Instructions (Signed)
Continue current medications  Followup in 1 year sooner if any problems 

## 2012-09-08 NOTE — Progress Notes (Signed)
  Subjective:    Patient ID: Daniel Cochran, male    DOB: 10-Oct-1940, 72 y.o.   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 Over time he was treated by a chiropractor. He then developed some atrophy of his right gastrocnemius and was told that was related to the fact that he had been nerve damage. I concur with that no workup indicated at this time.  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   HENT: Negative.   Eyes: Negative.   Respiratory: Negative.   Cardiovascular: Negative.   Gastrointestinal: Negative.   Genitourinary: Negative.   Musculoskeletal: Negative.   Skin: Negative.   Neurological: Negative.   Psychiatric/Behavioral: Negative.        Objective:   Physical Exam  Constitutional: He is oriented to person, place, and time. He appears well-developed and well-nourished.  HENT:  Head: Normocephalic and atraumatic.  Right Ear: External ear normal.  Left Ear: External ear normal.  Nose: Nose normal.  Mouth/Throat: Oropharynx is clear and moist.  Eyes: Conjunctivae and EOM are normal. Pupils are equal, round, and reactive to light.  Neck: Normal range of motion. Neck supple. No JVD present. No tracheal deviation present. No thyromegaly present.  Cardiovascular: Normal rate, regular rhythm, normal heart sounds and intact distal pulses.  Exam reveals no gallop and no friction rub.   No murmur heard. Pulmonary/Chest:  Effort normal and breath sounds normal. No stridor. No respiratory distress. He has no wheezes. He has no rales. He exhibits no tenderness.  Abdominal: Soft. Bowel sounds are normal. He exhibits no distension and no mass. There is no tenderness. There is no rebound and no guarding.  Genitourinary: Rectum normal and penis normal. Guaiac negative stool. No penile tenderness.  2+ BPH nonnodular  Musculoskeletal: Normal range of motion. He exhibits no edema and no tenderness.  Lymphadenopathy:    He has no cervical adenopathy.  Neurological: He is alert and oriented to person, place, and time. He has normal reflexes. No cranial nerve deficit. He exhibits normal muscle tone.  Skin: Skin is warm and dry. No rash noted. No erythema. No pallor.  Psychiatric: He has a normal mood and affect. His behavior is normal. Judgment and thought content normal.          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

## 2013-06-07 ENCOUNTER — Ambulatory Visit (INDEPENDENT_AMBULATORY_CARE_PROVIDER_SITE_OTHER): Payer: Medicare Other

## 2013-06-07 DIAGNOSIS — Z23 Encounter for immunization: Secondary | ICD-10-CM

## 2013-06-21 ENCOUNTER — Other Ambulatory Visit: Payer: Self-pay | Admitting: *Deleted

## 2013-06-21 MED ORDER — AMLODIPINE BESYLATE 10 MG PO TABS: ORAL_TABLET | ORAL | Status: DC

## 2013-09-05 ENCOUNTER — Other Ambulatory Visit: Payer: Medicare Other

## 2013-09-11 ENCOUNTER — Encounter: Payer: Medicare Other | Admitting: Family Medicine

## 2013-09-18 ENCOUNTER — Other Ambulatory Visit: Payer: Self-pay | Admitting: Family Medicine

## 2013-09-25 ENCOUNTER — Other Ambulatory Visit (INDEPENDENT_AMBULATORY_CARE_PROVIDER_SITE_OTHER): Payer: Medicare Other

## 2013-09-25 DIAGNOSIS — Z0000 Encounter for general adult medical examination without abnormal findings: Secondary | ICD-10-CM

## 2013-09-25 DIAGNOSIS — E785 Hyperlipidemia, unspecified: Secondary | ICD-10-CM

## 2013-09-25 DIAGNOSIS — I10 Essential (primary) hypertension: Secondary | ICD-10-CM

## 2013-09-25 LAB — LIPID PANEL
CHOL/HDL RATIO: 3
CHOLESTEROL: 122 mg/dL (ref 0–200)
HDL: 41.5 mg/dL (ref >39.00)
LDL CALC: 67 mg/dL (ref 0–99)
TRIGLYCERIDES: 69 mg/dL (ref 0.0–149.0)
VLDL: 13.8 mg/dL (ref 0.0–40.0)

## 2013-09-25 LAB — CBC WITH DIFFERENTIAL/PLATELET
BASOS PCT: 0.4 % (ref 0.0–3.0)
Basophils Absolute: 0 10*3/uL (ref 0.0–0.1)
EOS PCT: 1.4 % (ref 0.0–5.0)
Eosinophils Absolute: 0.1 10*3/uL (ref 0.0–0.7)
HCT: 45 % (ref 39.0–52.0)
Hemoglobin: 15 g/dL (ref 13.0–17.0)
LYMPHS PCT: 33.8 % (ref 12.0–46.0)
Lymphs Abs: 2.6 10*3/uL (ref 0.7–4.0)
MCHC: 33.4 g/dL (ref 30.0–36.0)
MCV: 92.2 fl (ref 78.0–100.0)
MONO ABS: 0.6 10*3/uL (ref 0.1–1.0)
MONOS PCT: 7.4 % (ref 3.0–12.0)
NEUTROS PCT: 57 % (ref 43.0–77.0)
Neutro Abs: 4.3 10*3/uL (ref 1.4–7.7)
PLATELETS: 257 10*3/uL (ref 150.0–400.0)
RBC: 4.88 Mil/uL (ref 4.22–5.81)
RDW: 13 % (ref 11.5–14.6)
WBC: 7.6 10*3/uL (ref 4.5–10.5)

## 2013-09-25 LAB — POCT URINALYSIS DIPSTICK
Bilirubin, UA: NEGATIVE
GLUCOSE UA: NEGATIVE
Ketones, UA: NEGATIVE
Leukocytes, UA: NEGATIVE
NITRITE UA: NEGATIVE
PH UA: 7.5
RBC UA: NEGATIVE
SPEC GRAV UA: 1.015
UROBILINOGEN UA: 0.2

## 2013-09-25 LAB — HEPATIC FUNCTION PANEL
ALT: 28 U/L (ref 0–53)
AST: 34 U/L (ref 0–37)
Albumin: 4.5 g/dL (ref 3.5–5.2)
Alkaline Phosphatase: 73 U/L (ref 39–117)
BILIRUBIN DIRECT: 0.2 mg/dL (ref 0.0–0.3)
TOTAL PROTEIN: 7.1 g/dL (ref 6.0–8.3)
Total Bilirubin: 1 mg/dL (ref 0.3–1.2)

## 2013-09-25 LAB — TSH: TSH: 2.04 u[IU]/mL (ref 0.35–5.50)

## 2013-09-25 LAB — BASIC METABOLIC PANEL
BUN: 14 mg/dL (ref 6–23)
CHLORIDE: 104 meq/L (ref 96–112)
CO2: 26 mEq/L (ref 19–32)
CREATININE: 1 mg/dL (ref 0.4–1.5)
Calcium: 9.3 mg/dL (ref 8.4–10.5)
GFR: 80.63 mL/min (ref >60.00)
Glucose, Bld: 138 mg/dL — ABNORMAL HIGH (ref 70–99)
Potassium: 4.2 mEq/L (ref 3.5–5.1)
Sodium: 140 mEq/L (ref 135–145)

## 2013-09-25 LAB — PSA: PSA: 2.06 ng/mL (ref 0.10–4.00)

## 2013-10-02 ENCOUNTER — Ambulatory Visit (INDEPENDENT_AMBULATORY_CARE_PROVIDER_SITE_OTHER): Payer: Medicare Other | Admitting: Family Medicine

## 2013-10-02 ENCOUNTER — Encounter: Payer: Self-pay | Admitting: Family Medicine

## 2013-10-02 VITALS — BP 180/100 | Temp 98.7°F | Wt 201.0 lb

## 2013-10-02 DIAGNOSIS — Z0000 Encounter for general adult medical examination without abnormal findings: Secondary | ICD-10-CM

## 2013-10-02 DIAGNOSIS — R739 Hyperglycemia, unspecified: Secondary | ICD-10-CM

## 2013-10-02 DIAGNOSIS — E785 Hyperlipidemia, unspecified: Secondary | ICD-10-CM

## 2013-10-02 DIAGNOSIS — I10 Essential (primary) hypertension: Secondary | ICD-10-CM

## 2013-10-02 DIAGNOSIS — Z23 Encounter for immunization: Secondary | ICD-10-CM

## 2013-10-02 DIAGNOSIS — F528 Other sexual dysfunction not due to a substance or known physiological condition: Secondary | ICD-10-CM

## 2013-10-02 MED ORDER — SILDENAFIL CITRATE 100 MG PO TABS: 100.0000 mg | ORAL_TABLET | Freq: Every evening | ORAL | Status: DC | PRN

## 2013-10-02 MED ORDER — SIMVASTATIN 40 MG PO TABS: ORAL_TABLET | ORAL | Status: DC

## 2013-10-02 MED ORDER — LABETALOL HCL 200 MG PO TABS: ORAL_TABLET | ORAL | Status: DC

## 2013-10-02 MED ORDER — CLONIDINE HCL 0.1 MG PO TABS: ORAL_TABLET | ORAL | Status: DC

## 2013-10-02 MED ORDER — AMLODIPINE BESYLATE 10 MG PO TABS: ORAL_TABLET | ORAL | Status: DC

## 2013-10-02 MED ORDER — FUROSEMIDE 40 MG PO TABS: 40.0000 mg | ORAL_TABLET | Freq: Two times a day (BID) | ORAL | Status: DC

## 2013-10-02 NOTE — Patient Instructions (Signed)
Continue your current medications  [email or web address] for the Viagra  Return in one year sooner if any problem

## 2013-10-02 NOTE — Progress Notes (Signed)
   Subjective:    Patient ID: Daniel Cochran, male    DOB: 1941-05-21, 73 y.o.   MRN: [id number]  HPI Is a 73 year old male nonsmoker who comes in today for a Medicare wellness examination because of a history of hypertension, hyperlipidemia, erectile dysfunction  Med list reviewed they're accurate. Vaccinations updated  Cognitive functions normal he walks on a regular basis home safety reviewed no issues identified, no guns in the house, he does have a health care power of attorney and living well   Review of Systems  Constitutional: Negative.   HENT: Negative.   Eyes: Negative.   Respiratory: Negative.   Cardiovascular: Negative.   Gastrointestinal: Negative.   Endocrine: Negative.   Genitourinary: Negative.   Musculoskeletal: Negative.   Skin: Negative.   Allergic/Immunologic: Negative.   Neurological: Negative.   Hematological: Negative.   Psychiatric/Behavioral: Negative.        Objective:   Physical Exam  Nursing note and vitals reviewed. Constitutional: He is oriented to person, place, and time. He appears well-developed and well-nourished.  HENT:  Head: Normocephalic and atraumatic.  Right Ear: External ear normal.  Left Ear: External ear normal.  Nose: Nose normal.  Mouth/Throat: Oropharynx is clear and moist.  Eyes: Conjunctivae and EOM are normal. Pupils are equal, round, and reactive to light.  Neck: Normal range of motion. Neck supple. No JVD present. No tracheal deviation present. No thyromegaly present.  Cardiovascular: Normal rate, regular rhythm, normal heart sounds and intact distal pulses.  Exam reveals no gallop and no friction rub.   No murmur heard. Pulmonary/Chest: Effort normal and breath sounds normal. No stridor. No respiratory distress. He has no wheezes. He has no rales. He exhibits no tenderness.  Abdominal: Soft. Bowel sounds are normal. He exhibits no distension and no mass. There is no tenderness. There is no rebound and no guarding.    Genitourinary: Rectum normal, prostate normal and penis normal. Guaiac negative stool. No penile tenderness.  Musculoskeletal: Normal range of motion. He exhibits no edema and no tenderness.  Lymphadenopathy:    He has no cervical adenopathy.  Neurological: He is alert and oriented to person, place, and time. He has normal reflexes. No cranial nerve deficit. He exhibits normal muscle tone.  Skin: Skin is warm and dry. No rash noted. No erythema. No pallor.  Psychiatric: He has a normal mood and affect. His behavior is normal. Judgment and thought content normal.   no carotid or bruits peripheral pulses 1+ and symmetrical Slight bulge left groin consistent with a hernia reducible       Assessment & Plan:  Healthy male  Hypertension at goal continue current therapy  Erectile dysfunction continue Viagra when necessary  Simvastatin for hyperlipidemia 40 mg daily  New problem of slight right inguinal hernia

## 2013-10-02 NOTE — Progress Notes (Signed)
Pre visit review using our clinic review tool, if applicable. No additional management support is needed unless otherwise documented below in the visit note. 

## 2013-10-03 ENCOUNTER — Telehealth: Payer: Self-pay | Admitting: Family Medicine

## 2013-10-03 NOTE — Telephone Encounter (Signed)
Relevant patient education mailed to patient.  

## 2014-06-07 ENCOUNTER — Ambulatory Visit (INDEPENDENT_AMBULATORY_CARE_PROVIDER_SITE_OTHER): Payer: Medicare Other

## 2014-06-07 DIAGNOSIS — Z23 Encounter for immunization: Secondary | ICD-10-CM

## 2014-08-15 ENCOUNTER — Other Ambulatory Visit: Payer: Self-pay

## 2014-08-15 DIAGNOSIS — I10 Essential (primary) hypertension: Secondary | ICD-10-CM

## 2014-08-15 NOTE — Telephone Encounter (Signed)
Rx request for amlodipine besylate 10 mg tablet- Take 1 tablet by mouth daily #100  Pharm:  [facility]  [place]  Pls advise.

## 2014-08-16 MED ORDER — AMLODIPINE BESYLATE 10 MG PO TABS: ORAL_TABLET | ORAL | Status: DC

## 2014-09-27 ENCOUNTER — Other Ambulatory Visit (INDEPENDENT_AMBULATORY_CARE_PROVIDER_SITE_OTHER): Payer: Medicare Other

## 2014-09-27 DIAGNOSIS — Z0000 Encounter for general adult medical examination without abnormal findings: Secondary | ICD-10-CM

## 2014-09-27 LAB — TSH: TSH: 1.88 u[IU]/mL (ref 0.35–4.50)

## 2014-09-27 LAB — LIPID PANEL
CHOL/HDL RATIO: 3
Cholesterol: 135 mg/dL (ref 0–200)
HDL: 41.6 mg/dL (ref >39.00)
LDL Cholesterol: 72 mg/dL (ref 0–99)
NONHDL: 93.4
TRIGLYCERIDES: 107 mg/dL (ref 0.0–149.0)
VLDL: 21.4 mg/dL (ref 0.0–40.0)

## 2014-09-27 LAB — CBC WITH DIFFERENTIAL/PLATELET
Basophils Absolute: 0 10*3/uL (ref 0.0–0.1)
Basophils Relative: 0.3 % (ref 0.0–3.0)
Eosinophils Absolute: 0.1 10*3/uL (ref 0.0–0.7)
Eosinophils Relative: 1.3 % (ref 0.0–5.0)
HCT: 46 % (ref 39.0–52.0)
HEMOGLOBIN: 15.9 g/dL (ref 13.0–17.0)
LYMPHS ABS: 2.7 10*3/uL (ref 0.7–4.0)
LYMPHS PCT: 30 % (ref 12.0–46.0)
MCHC: 34.6 g/dL (ref 30.0–36.0)
MCV: 89.5 fl (ref 78.0–100.0)
MONOS PCT: 6.6 % (ref 3.0–12.0)
Monocytes Absolute: 0.6 10*3/uL (ref 0.1–1.0)
NEUTROS PCT: 61.8 % (ref 43.0–77.0)
Neutro Abs: 5.5 10*3/uL (ref 1.4–7.7)
PLATELETS: 286 10*3/uL (ref 150.0–400.0)
RBC: 5.13 Mil/uL (ref 4.22–5.81)
RDW: 13 % (ref 11.5–15.5)
WBC: 8.8 10*3/uL (ref 4.0–10.5)

## 2014-09-27 LAB — POCT URINALYSIS DIPSTICK
Bilirubin, UA: NEGATIVE
Blood, UA: NEGATIVE
Glucose, UA: NEGATIVE
Ketones, UA: NEGATIVE
Leukocytes, UA: NEGATIVE
Nitrite, UA: NEGATIVE
PROTEIN UA: NEGATIVE
Spec Grav, UA: 1.015
Urobilinogen, UA: 0.2
pH, UA: 7

## 2014-09-27 LAB — COMPREHENSIVE METABOLIC PANEL
ALBUMIN: 4.8 g/dL (ref 3.5–5.2)
ALK PHOS: 101 U/L (ref 39–117)
ALT: 24 U/L (ref 0–53)
AST: 31 U/L (ref 0–37)
BUN: 18 mg/dL (ref 6–23)
CHLORIDE: 99 meq/L (ref 96–112)
CO2: 33 mEq/L — ABNORMAL HIGH (ref 19–32)
Calcium: 9.6 mg/dL (ref 8.4–10.5)
Creatinine, Ser: 1.18 mg/dL (ref 0.40–1.50)
GFR: 64.13 mL/min (ref >60.00)
Glucose, Bld: 189 mg/dL — ABNORMAL HIGH (ref 70–99)
Potassium: 4.5 mEq/L (ref 3.5–5.1)
Sodium: 138 mEq/L (ref 135–145)
TOTAL PROTEIN: 7.4 g/dL (ref 6.0–8.3)
Total Bilirubin: 0.8 mg/dL (ref 0.2–1.2)

## 2014-09-27 LAB — PSA: PSA: 1.87 ng/mL (ref 0.10–4.00)

## 2014-10-04 ENCOUNTER — Encounter: Payer: Self-pay | Admitting: Family Medicine

## 2014-10-04 ENCOUNTER — Ambulatory Visit (INDEPENDENT_AMBULATORY_CARE_PROVIDER_SITE_OTHER): Payer: Medicare Other | Admitting: Family Medicine

## 2014-10-04 VITALS — BP 130/80 | Temp 98.2°F | Ht 72.0 in | Wt 200.0 lb

## 2014-10-04 DIAGNOSIS — I10 Essential (primary) hypertension: Secondary | ICD-10-CM

## 2014-10-04 DIAGNOSIS — E785 Hyperlipidemia, unspecified: Secondary | ICD-10-CM

## 2014-10-04 DIAGNOSIS — F528 Other sexual dysfunction not due to a substance or known physiological condition: Secondary | ICD-10-CM

## 2014-10-04 DIAGNOSIS — Z0000 Encounter for general adult medical examination without abnormal findings: Secondary | ICD-10-CM

## 2014-10-04 DIAGNOSIS — E139 Other specified diabetes mellitus without complications: Secondary | ICD-10-CM | POA: Diagnosis not present

## 2014-10-04 LAB — MICROALBUMIN / CREATININE URINE RATIO
Creatinine,U: 288.2 mg/dL
MICROALB/CREAT RATIO: 1.7 mg/g (ref 0.0–30.0)
Microalb, Ur: 4.9 mg/dL — ABNORMAL HIGH (ref 0.0–1.9)

## 2014-10-04 LAB — HEMOGLOBIN A1C: Hgb A1c MFr Bld: 7.9 % — ABNORMAL HIGH (ref 4.6–6.5)

## 2014-10-04 MED ORDER — AMLODIPINE BESYLATE 10 MG PO TABS: ORAL_TABLET | ORAL | Status: DC

## 2014-10-04 MED ORDER — FUROSEMIDE 40 MG PO TABS: 40.0000 mg | ORAL_TABLET | Freq: Two times a day (BID) | ORAL | Status: DC

## 2014-10-04 MED ORDER — CLONIDINE HCL 0.1 MG PO TABS: ORAL_TABLET | ORAL | Status: DC

## 2014-10-04 MED ORDER — SILDENAFIL CITRATE 100 MG PO TABS: 100.0000 mg | ORAL_TABLET | Freq: Every evening | ORAL | Status: DC | PRN

## 2014-10-04 MED ORDER — SIMVASTATIN 40 MG PO TABS: ORAL_TABLET | ORAL | Status: DC

## 2014-10-04 MED ORDER — LABETALOL HCL 200 MG PO TABS: ORAL_TABLET | ORAL | Status: DC

## 2014-10-04 MED ORDER — METFORMIN HCL 500 MG PO TABS: ORAL_TABLET | ORAL | Status: DC

## 2014-10-04 NOTE — Progress Notes (Signed)
Pre visit review using our clinic review tool, if applicable. No additional management support is needed unless otherwise documented below in the visit note. 

## 2014-10-04 NOTE — Progress Notes (Signed)
Subjective:    Patient ID: Daniel Cochran, male    DOB: 1941/01/12, 74 y.o.   MRN: [id number]  HPI Gwynn is a 74 year old male married nonsmoker who comes in today for general physical examination because of a history of hypertension, hyperlipidemia, and mild erectile dysfunction  His med list reviewed the been no changes. On his blood pressure medications as BP is 120/80  He gets routine eye care, dental care, colonoscopy and GI last one was normal.  Vaccinations are up-to-date  Cognitive function normal he walks on a regular basis home health safety reviewed no issues identified, no guns in the house, he does have a healthcare power of attorney and living well  In reviewing his labs his blood sugar fasting is 189. He therefore is a new type II diabetic. We explained the nature of the problem. He's had glucose intolerance in the past and is been on a carbohydrate free diet since last year. Positive family history of diabetes. We reviewed the nature of diabetes talked about the treatment. We'll begin metformin 2050 mg before breakfast. Fasting blood sugar daily at home patient was given a glucometer and showed how to use it. Follow-up in one month  Diabetic education done   Review of Systems  Constitutional: Negative.   HENT: Negative.   Eyes: Negative.   Respiratory: Negative.   Cardiovascular: Negative.   Gastrointestinal: Negative.   Endocrine: Negative.   Genitourinary: Negative.   Musculoskeletal: Negative.   Skin: Negative.   Allergic/Immunologic: Negative.   Neurological: Negative.   Hematological: Negative.   Psychiatric/Behavioral: Negative.        Objective:   Physical Exam  Constitutional: He is oriented to person, place, and time. He appears well-developed and well-nourished.  HENT:  Head: Normocephalic and atraumatic.  Right Ear: External ear normal.  Left Ear: External ear normal.  Nose: Nose normal.  Mouth/Throat: Oropharynx is clear and moist.    Eyes: Conjunctivae and EOM are normal. Pupils are equal, round, and reactive to light.  Neck: Normal range of motion. Neck supple. No JVD present. No tracheal deviation present. No thyromegaly present.  Cardiovascular: Normal rate, regular rhythm, normal heart sounds and intact distal pulses.  Exam reveals no gallop and no friction rub.   No murmur heard. No carotid nor aortic bruits  Pulmonary/Chest: Effort normal and breath sounds normal. No stridor. No respiratory distress. He has no wheezes. He has no rales. He exhibits no tenderness.  Abdominal: Soft. Bowel sounds are normal. He exhibits no distension and no mass. There is no tenderness. There is no rebound and no guarding.  Genitourinary: Rectum normal and penis normal. Guaiac negative stool. No penile tenderness.  1+ symmetrical nonnodular BPH  Musculoskeletal: Normal range of motion. He exhibits no edema or tenderness.  Lymphadenopathy:    He has no cervical adenopathy.  Neurological: He is alert and oriented to person, place, and time. He has normal reflexes. No cranial nerve deficit. He exhibits normal muscle tone.  Skin: Skin is warm and dry. No rash noted. No erythema. No pallor.  Psychiatric: He has a normal mood and affect. His behavior is normal. Judgment and thought content normal.  Nursing note and vitals reviewed.         Assessment & Plan:  Hypertension ago continue current therapy  Hyperlipidemia at goal continue Zocor 40 mg daily  History of ED mild continue bagger when necessary  New onset diabetes....... fasting blood sugar 189........ patient is already on a carbohydrate free diet exercises  on a regular basis begin metformin 250 mg  a day follow-up in one month

## 2014-10-04 NOTE — Patient Instructions (Addendum)
Continue current medications  Follow-up in one year sooner if any problems  Rachel's extension is........ [place]  In 10 year carbohydrate free diet and daily exercise  Metformin 500 mg........ one half tablet before breakfast  Check your blood sugar in the morning daily  Return in one month for follow-up with a record of all your blood sugar readings  Hemoglobin A1c and special urine test today

## 2014-10-08 ENCOUNTER — Telehealth: Payer: Self-pay | Admitting: Family Medicine

## 2014-10-08 MED ORDER — ACCU-CHEK SOFTCLIX LANCETS MISC: Status: DC

## 2014-10-08 MED ORDER — GLUCOSE BLOOD VI STRP: ORAL_STRIP | Status: DC

## 2014-10-08 NOTE — Telephone Encounter (Signed)
Rx sent to pharmacy   

## 2014-10-08 NOTE — Telephone Encounter (Signed)
Patient need Aviva test strips sent to [facility] in [place].  He was given Aviva Glucometer last week.

## 2014-11-06 ENCOUNTER — Encounter: Payer: Self-pay | Admitting: Family Medicine

## 2014-11-06 ENCOUNTER — Ambulatory Visit (INDEPENDENT_AMBULATORY_CARE_PROVIDER_SITE_OTHER): Payer: Medicare Other | Admitting: Family Medicine

## 2014-11-06 VITALS — BP 140/90 | Temp 98.4°F | Wt 193.0 lb

## 2014-11-06 DIAGNOSIS — E119 Type 2 diabetes mellitus without complications: Secondary | ICD-10-CM | POA: Diagnosis not present

## 2014-11-06 MED ORDER — GLUCOSE BLOOD VI STRP: ORAL_STRIP | Status: DC

## 2014-11-06 MED ORDER — METFORMIN HCL 500 MG PO TABS: ORAL_TABLET | ORAL | Status: DC

## 2014-11-06 NOTE — Patient Instructions (Signed)
Metformin 500 mg.......Marland Kitchen 1 tablet prior to breakfast.... Half a tablet prior to your evening meal  Check a fasting blood sugar first thing in the morning  Return in one month for follow-up with all your blood sugar readings and the device  Blood sugar goal in the morning 90 to 1:30 range

## 2014-11-06 NOTE — Progress Notes (Signed)
   Subjective:    Patient ID: KHALFANI WEIDEMAN, male    DOB: January 28, 1941, 74 y.o.   MRN: [id number]  HPI Abdulrahman is a 74 year old married male nonsmoker who comes in today for follow-up of new-onset diabetes  He's taking his metformin 250 mg daily but after a meal. Advised to take it before breakfast  Blood sugars in the 150-170 range. A1c a month ago was 7.9%.  Blood pressure normal on medication   Review of Systems Review of systems otherwise negative except he's not sure this meters functioning properly. He brought with him. We will check his meter today    Objective:   Physical Exam Well-developed well-nourished male no acute distress vital signs stable he is afebrile BP 140/90 blood sugar today 152 at home.......Marland Kitchen blood sugar with the new meter 170       Assessment & Plan:  Diabetes type 2 new-onset not at goal......... discuss some dietary changes..... Continue daily exercise......... increase metformin to 500 mg before breakfast and 250 mg before his evening male  Follow-up in one month

## 2014-11-06 NOTE — Progress Notes (Signed)
Pre visit review using our clinic review tool, if applicable. No additional management support is needed unless otherwise documented below in the visit note. 

## 2014-11-13 ENCOUNTER — Telehealth: Payer: Self-pay | Admitting: Family Medicine

## 2014-11-13 MED ORDER — GLUCOSE BLOOD VI STRP: ORAL_STRIP | Status: DC

## 2014-11-13 NOTE — Telephone Encounter (Signed)
Pt request refill of the following: glucose blood (ACCU-CHEK AVIVA) test strip  Pharmacy said they donot have the above rx. Will you resend please    Phamacy: [facility] [place]

## 2014-11-13 NOTE — Telephone Encounter (Signed)
Pt has new meter and needs new  Lancets. Needs fast clix [facility] [place]

## 2014-11-13 NOTE — Telephone Encounter (Signed)
Rx sent 

## 2014-11-14 MED ORDER — ACCU-CHEK FASTCLIX LANCETS MISC: Status: DC

## 2014-11-14 NOTE — Telephone Encounter (Signed)
Rx sent to pharmacy   

## 2014-12-06 ENCOUNTER — Ambulatory Visit (INDEPENDENT_AMBULATORY_CARE_PROVIDER_SITE_OTHER): Payer: Medicare Other | Admitting: Family Medicine

## 2014-12-06 VITALS — BP 140/90 | Temp 98.4°F | Wt 193.0 lb

## 2014-12-06 DIAGNOSIS — E119 Type 2 diabetes mellitus without complications: Secondary | ICD-10-CM

## 2014-12-06 LAB — BASIC METABOLIC PANEL
BUN: 12 mg/dL (ref 6–23)
CO2: 28 mEq/L (ref 19–32)
Calcium: 9.5 mg/dL (ref 8.4–10.5)
Chloride: 103 mEq/L (ref 96–112)
Creatinine, Ser: 0.96 mg/dL (ref 0.40–1.50)
GFR: 81.33 mL/min (ref >60.00)
GLUCOSE: 131 mg/dL — AB (ref 70–99)
Potassium: 4.6 mEq/L (ref 3.5–5.1)
Sodium: 137 mEq/L (ref 135–145)

## 2014-12-06 LAB — HEMOGLOBIN A1C: Hgb A1c MFr Bld: 7.1 % — ABNORMAL HIGH (ref 4.6–6.5)

## 2014-12-06 NOTE — Progress Notes (Signed)
Pre visit review using our clinic review tool, if applicable. No additional management support is needed unless otherwise documented below in the visit note. 

## 2014-12-06 NOTE — Progress Notes (Signed)
   Subjective:    Patient ID: Daniel Cochran, male    DOB: 09-23-1940, 74 y.o.   MRN: [id number]  HPI  Daniel Cochran is a 74 year old male married nonsmoker who comes in today for follow-up of diabetes  We been gradually increasing his metformin. He's not taken 500 mg before breakfast and 250 mg before his evening meal. Blood sugars dropped in the 1 04/20/2019 range. A1c in March was 7.9%. We'll repeat A1c today   Review of Systems    review of systems otherwise negative Objective:   Physical Exam Well-developed well-nourished male no acute distress vital signs stable he is afebrile BP 140/90       Assessment & Plan:  Diabetes type 2 at goal,,,,,,, check A1c follow-up in 6 months if normal

## 2014-12-06 NOTE — Patient Instructions (Signed)
Labs today  We will call you the report next week.......Marland Kitchen Depending on your results we will then discuss follow-up

## 2014-12-14 ENCOUNTER — Other Ambulatory Visit: Payer: Self-pay | Admitting: Family Medicine

## 2015-02-19 LAB — HM DIABETES EYE EXAM

## 2015-02-28 ENCOUNTER — Encounter: Payer: Self-pay | Admitting: Family Medicine

## 2015-06-20 ENCOUNTER — Ambulatory Visit (INDEPENDENT_AMBULATORY_CARE_PROVIDER_SITE_OTHER): Payer: Medicare Other | Admitting: *Deleted

## 2015-06-20 DIAGNOSIS — Z23 Encounter for immunization: Secondary | ICD-10-CM

## 2015-07-30 ENCOUNTER — Other Ambulatory Visit: Payer: Self-pay

## 2015-07-30 DIAGNOSIS — I10 Essential (primary) hypertension: Secondary | ICD-10-CM

## 2015-07-30 MED ORDER — AMLODIPINE BESYLATE 10 MG PO TABS: ORAL_TABLET | ORAL | Status: DC

## 2015-08-22 DIAGNOSIS — H2511 Age-related nuclear cataract, right eye: Secondary | ICD-10-CM | POA: Diagnosis not present

## 2015-08-22 DIAGNOSIS — H401111 Primary open-angle glaucoma, right eye, mild stage: Secondary | ICD-10-CM | POA: Diagnosis not present

## 2015-08-22 DIAGNOSIS — E119 Type 2 diabetes mellitus without complications: Secondary | ICD-10-CM | POA: Diagnosis not present

## 2015-08-22 DIAGNOSIS — Z961 Presence of intraocular lens: Secondary | ICD-10-CM | POA: Diagnosis not present

## 2015-08-22 DIAGNOSIS — H401122 Primary open-angle glaucoma, left eye, moderate stage: Secondary | ICD-10-CM | POA: Diagnosis not present

## 2015-08-22 LAB — HM DIABETES EYE EXAM

## 2015-09-04 ENCOUNTER — Encounter: Payer: Self-pay | Admitting: Family Medicine

## 2015-10-07 ENCOUNTER — Encounter: Payer: Medicare Other | Admitting: Family Medicine

## 2015-10-28 ENCOUNTER — Ambulatory Visit (INDEPENDENT_AMBULATORY_CARE_PROVIDER_SITE_OTHER): Payer: Medicare Other | Admitting: Family Medicine

## 2015-10-28 ENCOUNTER — Encounter: Payer: Self-pay | Admitting: Family Medicine

## 2015-10-28 VITALS — BP 140/90 | Temp 97.9°F | Ht 72.0 in | Wt 187.0 lb

## 2015-10-28 DIAGNOSIS — E785 Hyperlipidemia, unspecified: Secondary | ICD-10-CM | POA: Diagnosis not present

## 2015-10-28 DIAGNOSIS — Z0000 Encounter for general adult medical examination without abnormal findings: Secondary | ICD-10-CM | POA: Diagnosis not present

## 2015-10-28 DIAGNOSIS — E119 Type 2 diabetes mellitus without complications: Secondary | ICD-10-CM | POA: Diagnosis not present

## 2015-10-28 DIAGNOSIS — I10 Essential (primary) hypertension: Secondary | ICD-10-CM | POA: Diagnosis not present

## 2015-10-28 DIAGNOSIS — F528 Other sexual dysfunction not due to a substance or known physiological condition: Secondary | ICD-10-CM

## 2015-10-28 DIAGNOSIS — E118 Type 2 diabetes mellitus with unspecified complications: Secondary | ICD-10-CM | POA: Insufficient documentation

## 2015-10-28 LAB — HEPATIC FUNCTION PANEL
ALT: 24 U/L (ref 0–53)
AST: 32 U/L (ref 0–37)
Albumin: 4.7 g/dL (ref 3.5–5.2)
Alkaline Phosphatase: 67 U/L (ref 39–117)
BILIRUBIN TOTAL: 0.8 mg/dL (ref 0.2–1.2)
Bilirubin, Direct: 0.2 mg/dL (ref 0.0–0.3)
Total Protein: 7.2 g/dL (ref 6.0–8.3)

## 2015-10-28 LAB — CBC WITH DIFFERENTIAL/PLATELET
Basophils Absolute: 0 10*3/uL (ref 0.0–0.1)
Basophils Relative: 0.3 % (ref 0.0–3.0)
EOS PCT: 1 % (ref 0.0–5.0)
Eosinophils Absolute: 0.1 10*3/uL (ref 0.0–0.7)
HCT: 45.4 % (ref 39.0–52.0)
HEMOGLOBIN: 15.4 g/dL (ref 13.0–17.0)
LYMPHS ABS: 2.3 10*3/uL (ref 0.7–4.0)
Lymphocytes Relative: 29.6 % (ref 12.0–46.0)
MCHC: 34 g/dL (ref 30.0–36.0)
MCV: 91.5 fl (ref 78.0–100.0)
MONOS PCT: 7.4 % (ref 3.0–12.0)
Monocytes Absolute: 0.6 10*3/uL (ref 0.1–1.0)
NEUTROS PCT: 61.7 % (ref 43.0–77.0)
Neutro Abs: 4.8 10*3/uL (ref 1.4–7.7)
Platelets: 283 10*3/uL (ref 150.0–400.0)
RBC: 4.97 Mil/uL (ref 4.22–5.81)
RDW: 13.3 % (ref 11.5–15.5)
WBC: 7.8 10*3/uL (ref 4.0–10.5)

## 2015-10-28 LAB — LIPID PANEL
Cholesterol: 109 mg/dL (ref 0–200)
HDL: 44.5 mg/dL (ref >39.00)
LDL Cholesterol: 51 mg/dL (ref 0–99)
NONHDL: 64.77
Total CHOL/HDL Ratio: 2
Triglycerides: 68 mg/dL (ref 0.0–149.0)
VLDL: 13.6 mg/dL (ref 0.0–40.0)

## 2015-10-28 LAB — MICROALBUMIN / CREATININE URINE RATIO
CREATININE, U: 229 mg/dL
MICROALB/CREAT RATIO: 3.2 mg/g (ref 0.0–30.0)
Microalb, Ur: 7.4 mg/dL — ABNORMAL HIGH (ref 0.0–1.9)

## 2015-10-28 LAB — BASIC METABOLIC PANEL
BUN: 17 mg/dL (ref 6–23)
CHLORIDE: 101 meq/L (ref 96–112)
CO2: 30 mEq/L (ref 19–32)
Calcium: 9.9 mg/dL (ref 8.4–10.5)
Creatinine, Ser: 1.03 mg/dL (ref 0.40–1.50)
GFR: 74.8 mL/min (ref >60.00)
Glucose, Bld: 152 mg/dL — ABNORMAL HIGH (ref 70–99)
POTASSIUM: 4.3 meq/L (ref 3.5–5.1)
SODIUM: 140 meq/L (ref 135–145)

## 2015-10-28 LAB — HEMOGLOBIN A1C: Hgb A1c MFr Bld: 6.7 % — ABNORMAL HIGH (ref 4.6–6.5)

## 2015-10-28 LAB — POCT URINALYSIS DIPSTICK
Blood, UA: NEGATIVE
Glucose, UA: NEGATIVE
LEUKOCYTES UA: NEGATIVE
NITRITE UA: NEGATIVE
Spec Grav, UA: 1.025
UROBILINOGEN UA: 1
pH, UA: 6

## 2015-10-28 LAB — PSA: PSA: 2.47 ng/mL (ref 0.10–4.00)

## 2015-10-28 LAB — TSH: TSH: 1.12 u[IU]/mL (ref 0.35–4.50)

## 2015-10-28 MED ORDER — CLONIDINE HCL 0.1 MG PO TABS: ORAL_TABLET | ORAL | Status: DC

## 2015-10-28 MED ORDER — METFORMIN HCL 500 MG PO TABS: ORAL_TABLET | ORAL | Status: DC

## 2015-10-28 MED ORDER — AMLODIPINE BESYLATE 10 MG PO TABS: ORAL_TABLET | ORAL | Status: DC

## 2015-10-28 MED ORDER — FUROSEMIDE 40 MG PO TABS: 40.0000 mg | ORAL_TABLET | Freq: Two times a day (BID) | ORAL | Status: DC

## 2015-10-28 MED ORDER — SIMVASTATIN 40 MG PO TABS: ORAL_TABLET | ORAL | Status: DC

## 2015-10-28 MED ORDER — SILDENAFIL CITRATE 100 MG PO TABS: 100.0000 mg | ORAL_TABLET | Freq: Every evening | ORAL | Status: DC | PRN

## 2015-10-28 MED ORDER — SILDENAFIL CITRATE 20 MG PO TABS: ORAL_TABLET | ORAL | Status: DC

## 2015-10-28 MED ORDER — LABETALOL HCL 200 MG PO TABS: ORAL_TABLET | ORAL | Status: DC

## 2015-10-28 NOTE — Patient Instructions (Signed)
Continue current medications diet and exercise  Follow-up in one year sooner if any problems,,,,,,,,,,, call in November 2017 for your physical exam April 2018,,,,,,,,,,,,,,, I think [facility] Corrigan would be a good fit with you

## 2015-10-28 NOTE — Progress Notes (Signed)
   Subjective:    Patient ID: Daniel Cochran, male    DOB: 1940-09-19, 75 y.o.   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 He takes his meds daily  He takes metformin 500 mg before breakfast and 250 mg before his evening meal blood sugars have been normal.  He takes Zocor 40 mg daily and an aspirin tablet. We'll check lipid panel today. He uses Viagra 100 mg for ED  He gets routine eye care, dental care, colonoscopy 9 years ago was normal.  Vaccinations up-to-date  Cognitive function normally exercises daily home health safety reviewed no issues identified, no guns in the house, he does have a healthcare power of attorney and living well.   Review of Systems  Constitutional: Negative.   HENT: Negative.   Eyes: Negative.   Respiratory: Negative.   Cardiovascular: Negative.   Gastrointestinal: Negative.   Endocrine: Negative.   Genitourinary: Negative.   Musculoskeletal: Negative.   Skin: Negative.   Allergic/Immunologic: Negative.   Neurological: Negative.   Hematological: Negative.   Psychiatric/Behavioral: Negative.        Objective:   Physical Exam  Constitutional: He is oriented to person, place, and time. He appears well-developed and well-nourished.  HENT:  Head: Normocephalic and atraumatic.  Right Ear: External ear normal.  Left Ear: External ear normal.  Nose: Nose normal.  Mouth/Throat: Oropharynx is clear and moist.  Eyes: Conjunctivae and EOM are normal. Pupils are equal, round, and reactive to light.  Neck: Normal range of motion. Neck supple. No JVD present. No tracheal deviation present. No thyromegaly present.  Cardiovascular: Normal rate, regular rhythm, normal heart sounds and intact distal pulses.  Exam reveals no gallop and no  friction rub.   No murmur heard. No carotid nor aortic bruits peripheral pulses 2+ and symmetrical  Pulmonary/Chest: Effort normal and breath sounds normal. No stridor. No respiratory distress. He has no wheezes. He has no rales. He exhibits no tenderness.  Abdominal: Soft. Bowel sounds are normal. He exhibits no distension and no mass. There is no tenderness. There is no rebound and no guarding.  Genitourinary: Rectum normal, prostate normal and penis normal. Guaiac negative stool. No penile tenderness.  Musculoskeletal: Normal range of motion. He exhibits no edema or tenderness.  Lymphadenopathy:    He has no cervical adenopathy.  Neurological: He is alert and oriented to person, place, and time. He has normal reflexes. No cranial nerve deficit. He exhibits normal muscle tone.  Skin: Skin is warm and dry. No rash noted. No erythema. No pallor.  Total body skin exam normal  Psychiatric: He has a normal mood and affect. His behavior is normal. Judgment and thought content normal.  Nursing note and vitals reviewed.         Assessment & Plan:  Healthy male  Hypertension at goal......... continue multiple medications  Diabetes type 2 at goal........... continue meds check labs  History of hyperlipidemia........... check labs continue current medication

## 2015-10-30 ENCOUNTER — Other Ambulatory Visit: Payer: Self-pay | Admitting: Family Medicine

## 2015-10-30 DIAGNOSIS — E119 Type 2 diabetes mellitus without complications: Secondary | ICD-10-CM

## 2015-12-06 ENCOUNTER — Telehealth: Payer: Self-pay | Admitting: Family Medicine

## 2015-12-06 ENCOUNTER — Ambulatory Visit (INDEPENDENT_AMBULATORY_CARE_PROVIDER_SITE_OTHER): Payer: Medicare Other | Admitting: Family Medicine

## 2015-12-06 ENCOUNTER — Encounter: Payer: Self-pay | Admitting: Family Medicine

## 2015-12-06 VITALS — BP 160/82 | HR 68 | Temp 98.4°F | Ht 72.0 in | Wt 193.0 lb

## 2015-12-06 DIAGNOSIS — T148 Other injury of unspecified body region: Secondary | ICD-10-CM

## 2015-12-06 DIAGNOSIS — IMO0002 Reserved for concepts with insufficient information to code with codable children: Secondary | ICD-10-CM

## 2015-12-06 NOTE — Patient Instructions (Addendum)
Change dressing once a day When you take it off- let water wash over it then some soapy water but do not scrub Then reapply dressing. If you cannot get dressing to stay- can use light layer of neosporin   See me back next Friday Have them make you an appointment at 8 AM (create slot) or can use any other same day appointment that day  You will have some tenderness and redness around site. If you have expanding redness or tenderness or fever see [place] back sooner.

## 2015-12-06 NOTE — Progress Notes (Signed)
Subjective:  Daniel Cochran is a 75 y.o. year old very pleasant male patient who presents for/with See problem oriented charting ROS- no fever, chills, nausea, vomiting.see any ROS included in HPI as well.   Past Medical History-  Patient Active Problem List   Diagnosis Date Noted  . Diabetes type 2, controlled ([place]) 10/28/2015  . [place] DISEASE, LUMBAR 03/21/2009  . Hyperlipidemia 11/22/2006  . ERECTILE DYSFUNCTION 11/22/2006  . Essential hypertension 11/22/2006    Medications- reviewed and updated Current Outpatient Prescriptions  Medication Sig Dispense Refill  . ACCU-CHEK FASTCLIX LANCETS MISC Use once daily. Dx E11.9 100 each 3  . amLODipine (NORVASC) 10 MG tablet TAKE ONE TABLET BY MOUTH EVERY DAY 100 tablet 3  . aspirin 81 MG tablet Take 81 mg by mouth daily.      . cloNIDine (CATAPRES) 0.1 MG tablet TAKE ONE TABLET BY MOUTH TWICE DAILY 200 tablet 3  . furosemide (LASIX) 40 MG tablet Take 1 tablet (40 mg total) by mouth 2 (two) times daily. 200 tablet 3  . glucose blood (ACCU-CHEK AVIVA) test strip Test daily. Dx code: E11.9 100 each 5  . labetalol (NORMODYNE) 200 MG tablet TAKE ONE TABLET BY MOUTH TWICE DAILY 200 tablet 3  . metFORMIN (GLUCOPHAGE) 500 MG tablet One tablet before breakfast and a half a tab prior to your evening meal 300 tablet 3  . sildenafil (REVATIO) 20 MG tablet Use as directed 20 tablet 10  . sildenafil (VIAGRA) 100 MG tablet Take 1 tablet (100 mg total) by mouth at bedtime as needed for erectile dysfunction (for erectile dysfunction). 10 tablet 10  . simvastatin (ZOCOR) 40 MG tablet TAKE ONE TABLET BY MOUTH EVERY DAY AT BEDTIME 100 tablet 3   No current facility-administered medications for this visit.    Objective: BP 160/82 mmHg  Pulse 68  Temp(Src) 98.4 F (36.9 C) (Oral)  Ht 6' (1.829 m)  Wt 193 lb (87.544 kg)  BMI 26.17 kg/m2 Gen: NAD, resting comfortably CV: RRR no murmurs rubs or gallops Lungs: CTAB no crackles, wheeze, rhonchi Ext: no  edema Skin: warm, dry, bleeding noted down the left side of face Neuro: Alert and oriented 4. Moves all extremities without difficulty. No extremity weakness. No speech difficulty  2 cm laceration noted on left scalp. Central 1 cm of wound with greater depth. Thoroughly irrigated.  Assessment/Plan:  Laceration left scalp S:Just before 1 pm. Changing a hydrolaulic cylinder and dropped a wrench and when he came back up he hit his left scalp. Bled pretty bad initially and trouble stopping. Takes aspirin and states bleeds very easily. Held pressure for 8 minutes but has continued to bleed despite now several hours of trying to pressure.Headache just recently developed but just around wound. No blurry vision. No confusion A/P: Wound was thoroughly cleansed with approximately a liter of saline. Hemostasis could not be achieved with pressure only. Considered Dermabond but in central portion of wound there appeared to be greater depth. We ultimately opted to close with 3 sutures of 5-0 ethilon over a 2 centimeter wound. Hemostasis was achieved and dressing was applied. Aftercare was discussed. We are going to hold aspirin for next week until suture removal next Friday (1 week).   In addition patient does not show any signs or symptoms of concussion. He does have some local irritation around the wound only. I called him after the visit around 7 PM and he was still feeling well. He states no further bleeding occurred.  Will repeat lipid  pressure evaluation next week when patient not in acute pain. But previously controlled. He has had some mild elevations on last few checks though.   Return precautions advised.   Garret Reddish, MD

## 2015-12-06 NOTE — Telephone Encounter (Signed)
Patient Name: Daniel Cochran DOB: 1941-04-06 Initial Comment Caller states he hit the top of his head on his tractor and he is having some bleeding on top of his head. Nurse Assessment Nurse: Ronnald Ramp, RN, Miranda Date/Time (Eastern Time): 12/06/2015 1:14:14 PM Confirm and document reason for call. If symptomatic, describe symptoms. You must click the next button to save text entered. ---Caller states he was bending down and when he stood up, he hit the top of his head on a jagged part of a tractor. He has a cut on the top of his head. He has the bleeding under control. Has the patient traveled out of the country within the last 30 days? ---Not Applicable Does the patient have any new or worsening symptoms? ---Yes Will a triage be completed? ---Yes Related visit to physician within the last 2 weeks? ---No Does the PT have any chronic conditions? (i.e. diabetes, asthma, etc.) ---Yes List chronic conditions. ---HTN, Diabetes Is this a behavioral health or substance abuse call? ---No Guidelines Guideline Title Affirmed Question Affirmed Notes Head Injury Patient is confused or is an unreliable provider of information (e.g., dementia, profound mental retardation, alcohol intoxication) Final Disposition User See Physician within 4 Hours (or PCP triage) Ronnald Ramp, RN, Miranda Comments Called backline and spoke with Arbie Cookey. She states there is someone in the office that can do sutures. No appt available with PCP, Appt scheduled for 3:45p with Dr. Yong Channel. Referrals REFERRED TO PCP OFFICE Disagree/Comply: Comply

## 2015-12-06 NOTE — Progress Notes (Signed)
Pre visit review using our clinic review tool, if applicable. No additional management support is needed unless otherwise documented below in the visit note. 

## 2015-12-12 ENCOUNTER — Other Ambulatory Visit: Payer: Self-pay | Admitting: Family Medicine

## 2015-12-13 ENCOUNTER — Encounter: Payer: Self-pay | Admitting: Family Medicine

## 2015-12-13 ENCOUNTER — Ambulatory Visit (INDEPENDENT_AMBULATORY_CARE_PROVIDER_SITE_OTHER): Payer: Medicare Other | Admitting: Family Medicine

## 2015-12-13 VITALS — Wt 193.0 lb

## 2015-12-13 DIAGNOSIS — Z4802 Encounter for removal of sutures: Secondary | ICD-10-CM

## 2015-12-13 NOTE — Progress Notes (Signed)
Suture removal- 3 removed from left scalp.  Wound healing well. No further follow up needed.   Garret Reddish, MD

## 2016-02-27 DIAGNOSIS — Z961 Presence of intraocular lens: Secondary | ICD-10-CM | POA: Diagnosis not present

## 2016-02-27 DIAGNOSIS — H2511 Age-related nuclear cataract, right eye: Secondary | ICD-10-CM | POA: Diagnosis not present

## 2016-02-27 DIAGNOSIS — H401111 Primary open-angle glaucoma, right eye, mild stage: Secondary | ICD-10-CM | POA: Diagnosis not present

## 2016-02-27 DIAGNOSIS — H401122 Primary open-angle glaucoma, left eye, moderate stage: Secondary | ICD-10-CM | POA: Diagnosis not present

## 2016-03-26 DIAGNOSIS — L570 Actinic keratosis: Secondary | ICD-10-CM | POA: Diagnosis not present

## 2016-03-26 DIAGNOSIS — D1801 Hemangioma of skin and subcutaneous tissue: Secondary | ICD-10-CM | POA: Diagnosis not present

## 2016-03-26 DIAGNOSIS — L814 Other melanin hyperpigmentation: Secondary | ICD-10-CM | POA: Diagnosis not present

## 2016-03-26 DIAGNOSIS — Z85828 Personal history of other malignant neoplasm of skin: Secondary | ICD-10-CM | POA: Diagnosis not present

## 2016-03-26 DIAGNOSIS — L821 Other seborrheic keratosis: Secondary | ICD-10-CM | POA: Diagnosis not present

## 2016-04-30 ENCOUNTER — Other Ambulatory Visit (INDEPENDENT_AMBULATORY_CARE_PROVIDER_SITE_OTHER): Payer: Medicare Other

## 2016-04-30 DIAGNOSIS — E119 Type 2 diabetes mellitus without complications: Secondary | ICD-10-CM

## 2016-04-30 LAB — MICROALBUMIN / CREATININE URINE RATIO
CREATININE, U: 152.5 mg/dL
MICROALB UR: 2.2 mg/dL — AB (ref 0.0–1.9)
MICROALB/CREAT RATIO: 1.4 mg/g (ref 0.0–30.0)

## 2016-04-30 LAB — BASIC METABOLIC PANEL
BUN: 17 mg/dL (ref 6–23)
CALCIUM: 9.5 mg/dL (ref 8.4–10.5)
CO2: 28 meq/L (ref 19–32)
CREATININE: 1.02 mg/dL (ref 0.40–1.50)
Chloride: 103 mEq/L (ref 96–112)
GFR: 75.55 mL/min (ref >60.00)
Glucose, Bld: 110 mg/dL — ABNORMAL HIGH (ref 70–99)
Potassium: 3.9 mEq/L (ref 3.5–5.1)
SODIUM: 139 meq/L (ref 135–145)

## 2016-04-30 LAB — HEMOGLOBIN A1C: HEMOGLOBIN A1C: 6.5 % (ref 4.6–6.5)

## 2016-05-05 ENCOUNTER — Encounter: Payer: Self-pay | Admitting: Family Medicine

## 2016-05-05 ENCOUNTER — Ambulatory Visit (INDEPENDENT_AMBULATORY_CARE_PROVIDER_SITE_OTHER): Payer: Medicare Other | Admitting: Family Medicine

## 2016-05-05 VITALS — BP 152/88 | HR 85 | Temp 97.9°F | Ht 72.0 in | Wt 187.9 lb

## 2016-05-05 DIAGNOSIS — N401 Enlarged prostate with lower urinary tract symptoms: Secondary | ICD-10-CM

## 2016-05-05 DIAGNOSIS — R351 Nocturia: Secondary | ICD-10-CM | POA: Diagnosis not present

## 2016-05-05 DIAGNOSIS — E119 Type 2 diabetes mellitus without complications: Secondary | ICD-10-CM

## 2016-05-05 DIAGNOSIS — Z23 Encounter for immunization: Secondary | ICD-10-CM

## 2016-05-05 DIAGNOSIS — I10 Essential (primary) hypertension: Secondary | ICD-10-CM | POA: Diagnosis not present

## 2016-05-05 NOTE — Progress Notes (Signed)
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 152/88 here  His weight is down 6 pounds since last spring's physical exam. He's cut out all the sugar. Because of that is now his weight is dropped his A1c is dropped to 6.7%. Blood sugar down to 110 no complaints of hypoglycemia  14 point review of systems reviewed otherwise negative  Vaccinations....... flu shot given today  Physical examination vital signs stable he is afebrile BP right arm sitting position 140/80 at home  Problem #1 diabetes at goal......... continue current therapy  #2 hypertension at goal.....Marland Kitchen continue monitoring home blood pressures..... Continue current medicines  Physical exam and establish with [facility] for long-term care April 2018

## 2016-05-05 NOTE — Progress Notes (Signed)
Pre visit review using our clinic review tool, if applicable. No additional management support is needed unless otherwise documented below in the visit note. 

## 2016-05-05 NOTE — Patient Instructions (Signed)
Excellent job on your diet continue that  Continue current medications  Follow-up mid April with [facility] for your annual physical exam and to establish for long-term care

## 2016-09-04 DIAGNOSIS — H401122 Primary open-angle glaucoma, left eye, moderate stage: Secondary | ICD-10-CM | POA: Diagnosis not present

## 2016-09-04 DIAGNOSIS — Z961 Presence of intraocular lens: Secondary | ICD-10-CM | POA: Diagnosis not present

## 2016-09-04 DIAGNOSIS — E119 Type 2 diabetes mellitus without complications: Secondary | ICD-10-CM | POA: Diagnosis not present

## 2016-09-04 DIAGNOSIS — H401111 Primary open-angle glaucoma, right eye, mild stage: Secondary | ICD-10-CM | POA: Diagnosis not present

## 2016-09-04 DIAGNOSIS — H2511 Age-related nuclear cataract, right eye: Secondary | ICD-10-CM | POA: Diagnosis not present

## 2016-09-04 LAB — HM DIABETES EYE EXAM

## 2016-09-18 ENCOUNTER — Encounter: Payer: Self-pay | Admitting: Family Medicine

## 2016-10-28 ENCOUNTER — Other Ambulatory Visit: Payer: Self-pay | Admitting: Family Medicine

## 2016-11-05 ENCOUNTER — Encounter: Payer: Medicare Other | Admitting: Adult Health

## 2016-12-15 ENCOUNTER — Encounter: Payer: Self-pay | Admitting: Adult Health

## 2016-12-15 ENCOUNTER — Ambulatory Visit (INDEPENDENT_AMBULATORY_CARE_PROVIDER_SITE_OTHER): Payer: Medicare Other | Admitting: Adult Health

## 2016-12-15 VITALS — BP 155/80 | Temp 98.3°F | Ht 72.0 in | Wt 184.0 lb

## 2016-12-15 DIAGNOSIS — I10 Essential (primary) hypertension: Secondary | ICD-10-CM | POA: Diagnosis not present

## 2016-12-15 DIAGNOSIS — F528 Other sexual dysfunction not due to a substance or known physiological condition: Secondary | ICD-10-CM

## 2016-12-15 DIAGNOSIS — E785 Hyperlipidemia, unspecified: Secondary | ICD-10-CM | POA: Diagnosis not present

## 2016-12-15 DIAGNOSIS — E119 Type 2 diabetes mellitus without complications: Secondary | ICD-10-CM | POA: Diagnosis not present

## 2016-12-15 LAB — LIPID PANEL
Cholesterol: 121 mg/dL (ref 0–200)
HDL: 48.3 mg/dL (ref >39.00)
LDL Cholesterol: 58 mg/dL (ref 0–99)
NONHDL: 72.54
TRIGLYCERIDES: 75 mg/dL (ref 0.0–149.0)
Total CHOL/HDL Ratio: 3
VLDL: 15 mg/dL (ref 0.0–40.0)

## 2016-12-15 LAB — CBC WITH DIFFERENTIAL/PLATELET
Basophils Absolute: 0 10*3/uL (ref 0.0–0.1)
Basophils Relative: 0.1 % (ref 0.0–3.0)
EOS PCT: 0.6 % (ref 0.0–5.0)
Eosinophils Absolute: 0.1 10*3/uL (ref 0.0–0.7)
HCT: 45.1 % (ref 39.0–52.0)
Hemoglobin: 15.2 g/dL (ref 13.0–17.0)
LYMPHS ABS: 1.7 10*3/uL (ref 0.7–4.0)
Lymphocytes Relative: 13.9 % (ref 12.0–46.0)
MCHC: 33.8 g/dL (ref 30.0–36.0)
MCV: 91.7 fl (ref 78.0–100.0)
MONOS PCT: 4 % (ref 3.0–12.0)
Monocytes Absolute: 0.5 10*3/uL (ref 0.1–1.0)
NEUTROS ABS: 9.7 10*3/uL — AB (ref 1.4–7.7)
Neutrophils Relative %: 81.4 % — ABNORMAL HIGH (ref 43.0–77.0)
PLATELETS: 272 10*3/uL (ref 150.0–400.0)
RBC: 4.92 Mil/uL (ref 4.22–5.81)
RDW: 12.9 % (ref 11.5–15.5)
WBC: 11.9 10*3/uL — ABNORMAL HIGH (ref 4.0–10.5)

## 2016-12-15 LAB — HEPATIC FUNCTION PANEL
ALBUMIN: 5 g/dL (ref 3.5–5.2)
ALT: 20 U/L (ref 0–53)
AST: 24 U/L (ref 0–37)
Alkaline Phosphatase: 63 U/L (ref 39–117)
Bilirubin, Direct: 0.2 mg/dL (ref 0.0–0.3)
TOTAL PROTEIN: 7.4 g/dL (ref 6.0–8.3)
Total Bilirubin: 0.8 mg/dL (ref 0.2–1.2)

## 2016-12-15 LAB — BASIC METABOLIC PANEL
BUN: 15 mg/dL (ref 6–23)
CALCIUM: 9.6 mg/dL (ref 8.4–10.5)
CHLORIDE: 102 meq/L (ref 96–112)
CO2: 27 mEq/L (ref 19–32)
Creatinine, Ser: 1.05 mg/dL (ref 0.40–1.50)
GFR: 72.94 mL/min (ref >60.00)
GLUCOSE: 148 mg/dL — AB (ref 70–99)
Potassium: 3.9 mEq/L (ref 3.5–5.1)
Sodium: 140 mEq/L (ref 135–145)

## 2016-12-15 LAB — HEMOGLOBIN A1C: Hgb A1c MFr Bld: 6.7 % — ABNORMAL HIGH (ref 4.6–6.5)

## 2016-12-15 LAB — PSA: PSA: 3.01 ng/mL (ref 0.10–4.00)

## 2016-12-15 LAB — TSH: TSH: 1.47 u[IU]/mL (ref 0.35–4.50)

## 2016-12-15 MED ORDER — ACCU-CHEK FASTCLIX LANCETS MISC: 3 refills | Status: DC

## 2016-12-15 MED ORDER — SILDENAFIL CITRATE 20 MG PO TABS: ORAL_TABLET | ORAL | 10 refills | Status: DC

## 2016-12-15 MED ORDER — ASPIRIN 81 MG PO TABS: 81.0000 mg | ORAL_TABLET | Freq: Every day | ORAL | 3 refills | Status: DC

## 2016-12-15 MED ORDER — FUROSEMIDE 40 MG PO TABS: 40.0000 mg | ORAL_TABLET | Freq: Two times a day (BID) | ORAL | 3 refills | Status: DC

## 2016-12-15 MED ORDER — AMLODIPINE BESYLATE 10 MG PO TABS: ORAL_TABLET | ORAL | 3 refills | Status: DC

## 2016-12-15 MED ORDER — SIMVASTATIN 40 MG PO TABS: ORAL_TABLET | ORAL | 3 refills | Status: DC

## 2016-12-15 MED ORDER — LABETALOL HCL 200 MG PO TABS: ORAL_TABLET | ORAL | 3 refills | Status: DC

## 2016-12-15 MED ORDER — CLONIDINE HCL 0.1 MG PO TABS: ORAL_TABLET | ORAL | 3 refills | Status: DC

## 2016-12-15 MED ORDER — METFORMIN HCL 500 MG PO TABS: ORAL_TABLET | ORAL | 8 refills | Status: DC

## 2016-12-15 MED ORDER — GLUCOSE BLOOD VI STRP: ORAL_STRIP | 4 refills | Status: DC

## 2016-12-15 NOTE — Progress Notes (Signed)
Subjective:    Patient ID: Daniel Cochran, male    DOB: 10-15-1940, 76 y.o.   MRN: [id number]  HPI  Patient presents for yearly preventative medicine examination. He is a pleasant 76 year old male who  has a past medical history of ED (erectile dysfunction); Hyperlipidemia; and Hypertension.  All immunizations and health maintenance protocols were reviewed with the patient and needed orders were placed.  Appropriate screening laboratory values were ordered for the patient including screening of hyperlipidemia, renal function and hepatic function. If indicated by BPH, a PSA was ordered.  Medication reconciliation,  past medical history, social history, problem list and allergies were reviewed in detail with the patient  Goals were established with regard to weight loss, exercise, and  diet in compliance with medications  End of life planning was discussed. He has an advanced directive and living will   He takes clonidine 0.1 and labetalol 200 mg BID, amlodipine 10mg ,  lasix 40 mg. His BP in the office today is 155/80 but he reports lower readings at home.   He takes Metformin 500mg  daily and 250 in the evening. He reports that his blood sugars are controlled at home.   Lab Results  Component Value Date   HGBA1C 6.5 04/30/2016   He has been to the eye doctor this year but has not seen the dentist.   He has no acute complaints today   Review of Systems  Constitutional: Negative.   HENT: Negative.   Eyes: Negative.   Respiratory: Negative.   Cardiovascular: Negative.   Gastrointestinal: Negative.   Endocrine: Negative.   Genitourinary: Negative.   Musculoskeletal: Negative.   Skin: Negative.   Allergic/Immunologic: Negative.   Neurological: Negative.   Hematological: Negative.   All other systems reviewed and are negative.  Past Medical History:  Diagnosis Date  . Diabetes mellitus ([place])   . ED (erectile dysfunction)   . Hyperlipidemia   . Hypertension      Social History   Social History  . Marital status: Married    Spouse name: N/A  . Number of children: N/A  . Years of education: N/A   Occupational History  . Not on file.   Social History Main Topics  . Smoking status: Never Smoker  . Smokeless tobacco: Never Used  . Alcohol use No  . Drug use: No  . Sexual activity: Not on file   Other Topics Concern  . Not on file   Social History Narrative  . No narrative on file    No past surgical history on file.  Family History  Problem Relation Age of Onset  . Hyperlipidemia Other   . Hypertension Other     No Known Allergies  Current Outpatient Prescriptions on File Prior to Visit  Medication Sig Dispense Refill  . ACCU-CHEK FASTCLIX LANCETS MISC Use once daily. Dx E11.9 100 each 3  . amLODipine (NORVASC) 10 MG tablet TAKE ONE TABLET BY MOUTH EVERY DAY 100 tablet 3  . sildenafil (VIAGRA) 100 MG tablet Take 1 tablet (100 mg total) by mouth at bedtime as needed for erectile dysfunction (for erectile dysfunction). 10 tablet 10   No current facility-administered medications on file prior to visit.     BP (!) 164/76 (BP Location: Left Arm, Patient Position: Sitting, Cuff Size: Normal)   Temp 98.3 F (36.8 C) (Oral)   Ht 6' (1.829 m)   Wt 184 lb (83.5 kg)   BMI 24.95 kg/m       Objective:  Physical Exam  Constitutional: He is oriented to person, place, and time. He appears well-developed and well-nourished. No distress.  HENT:  Head: Normocephalic and atraumatic.  Right Ear: External ear normal.  Left Ear: External ear normal.  Nose: Nose normal.  Mouth/Throat: Oropharynx is clear and moist. No oropharyngeal exudate.  Eyes: Conjunctivae and EOM are normal. Pupils are equal, round, and reactive to light. Right eye exhibits no discharge. Left eye exhibits no discharge. No scleral icterus.  Neck: Normal range of motion. Neck supple. No JVD present. Carotid bruit is not present. No tracheal deviation present. No  thyroid mass and no thyromegaly present.  Cardiovascular: Normal rate, regular rhythm, normal heart sounds and intact distal pulses.  Exam reveals no gallop and no friction rub.   No murmur heard. Pulmonary/Chest: Effort normal and breath sounds normal. No stridor. No respiratory distress. He has no wheezes. He has no rales. He exhibits no tenderness.  Abdominal: Soft. Bowel sounds are normal. He exhibits no distension and no mass. There is no tenderness. There is no rebound and no guarding.  Genitourinary:  Genitourinary Comments: Deferred   Musculoskeletal: Normal range of motion.  Lymphadenopathy:    He has no cervical adenopathy.  Neurological: He is alert and oriented to person, place, and time. No cranial nerve deficit. Coordination normal.  Skin: Skin is warm and dry. No rash noted. He is not diaphoretic. No erythema. No pallor.  Psychiatric: He has a normal mood and affect. His behavior is normal. Judgment and thought content normal.  Nursing note and vitals reviewed.     Assessment & Plan:  1. Essential hypertension - Better controlled at home - labetalol (NORMODYNE) 200 MG tablet; TAKE ONE TABLET BY MOUTH TWICE DAILY  Dispense: 200 tablet; Refill: 3 - cloNIDine (CATAPRES) 0.1 MG tablet; TAKE ONE TABLET BY MOUTH TWICE DAILY  Dispense: 200 tablet; Refill: 3 - furosemide (LASIX) 40 MG tablet; Take 1 tablet (40 mg total) by mouth 2 (two) times daily.  Dispense: 200 tablet; Refill: 3 - aspirin 81 MG tablet; Take 1 tablet (81 mg total) by mouth daily.  Dispense: 90 tablet; Refill: 3 - Basic metabolic panel - CBC with Differential/Platelet - Hemoglobin A1c - Hepatic function panel - Lipid panel - TSH - amLODipine (NORVASC) 10 MG tablet; TAKE ONE TABLET BY MOUTH EVERY DAY  Dispense: 100 tablet; Refill: 3  2. Controlled type 2 diabetes mellitus without complication, without long-term current use of insulin (HCC)  - metFORMIN (GLUCOPHAGE) 500 MG tablet; TAKE ONE TABLET BY MOUTH  BEFORE BREAKFAST AND ONE-HALF TABLET PRIOR TO YOUR EVENING MEAL  Dispense: 135 tablet; Refill: 8 - glucose blood (ACCU-CHEK AVIVA PLUS) test strip; USE ONE STRIP TO CHECK GLUCOSE ONCE DAILY  Dispense: 100 each; Refill: 4 - Basic metabolic panel - CBC with Differential/Platelet - Hemoglobin A1c - Hepatic function panel - Lipid panel - TSH  3. Hyperlipidemia, unspecified hyperlipidemia type  - simvastatin (ZOCOR) 40 MG tablet; TAKE ONE TABLET BY MOUTH EVERY DAY AT BEDTIME  Dispense: 100 tablet; Refill: 3 - Basic metabolic panel - CBC with Differential/Platelet - Hemoglobin A1c - Hepatic function panel - Lipid panel - TSH - PSA  4. ERECTILE DYSFUNCTION  - sildenafil (REVATIO) 20 MG tablet; Use as directed  Dispense: 20 tablet; Refill: 10 - Basic metabolic panel - CBC with Differential/Platelet - Hemoglobin A1c - Hepatic function panel - Lipid panel - TSH - PSA  Dorothyann Peng, NP

## 2017-03-04 DIAGNOSIS — H401111 Primary open-angle glaucoma, right eye, mild stage: Secondary | ICD-10-CM | POA: Diagnosis not present

## 2017-03-04 DIAGNOSIS — H401122 Primary open-angle glaucoma, left eye, moderate stage: Secondary | ICD-10-CM | POA: Diagnosis not present

## 2017-03-04 DIAGNOSIS — H04123 Dry eye syndrome of bilateral lacrimal glands: Secondary | ICD-10-CM | POA: Diagnosis not present

## 2017-03-30 DIAGNOSIS — D1801 Hemangioma of skin and subcutaneous tissue: Secondary | ICD-10-CM | POA: Diagnosis not present

## 2017-03-30 DIAGNOSIS — C4442 Squamous cell carcinoma of skin of scalp and neck: Secondary | ICD-10-CM | POA: Diagnosis not present

## 2017-03-30 DIAGNOSIS — Z85828 Personal history of other malignant neoplasm of skin: Secondary | ICD-10-CM | POA: Diagnosis not present

## 2017-03-30 DIAGNOSIS — L821 Other seborrheic keratosis: Secondary | ICD-10-CM | POA: Diagnosis not present

## 2017-03-30 DIAGNOSIS — L570 Actinic keratosis: Secondary | ICD-10-CM | POA: Diagnosis not present

## 2017-03-30 DIAGNOSIS — D485 Neoplasm of uncertain behavior of skin: Secondary | ICD-10-CM | POA: Diagnosis not present

## 2017-03-30 DIAGNOSIS — L814 Other melanin hyperpigmentation: Secondary | ICD-10-CM | POA: Diagnosis not present

## 2017-04-23 ENCOUNTER — Encounter: Payer: Self-pay | Admitting: Gastroenterology

## 2017-05-12 DIAGNOSIS — C4442 Squamous cell carcinoma of skin of scalp and neck: Secondary | ICD-10-CM | POA: Diagnosis not present

## 2017-05-12 DIAGNOSIS — D044 Carcinoma in situ of skin of scalp and neck: Secondary | ICD-10-CM | POA: Diagnosis not present

## 2017-05-21 ENCOUNTER — Ambulatory Visit (INDEPENDENT_AMBULATORY_CARE_PROVIDER_SITE_OTHER): Payer: Medicare Other

## 2017-05-21 DIAGNOSIS — Z23 Encounter for immunization: Secondary | ICD-10-CM

## 2017-09-07 DIAGNOSIS — H2511 Age-related nuclear cataract, right eye: Secondary | ICD-10-CM | POA: Diagnosis not present

## 2017-09-07 DIAGNOSIS — H401111 Primary open-angle glaucoma, right eye, mild stage: Secondary | ICD-10-CM | POA: Diagnosis not present

## 2017-09-07 DIAGNOSIS — Z961 Presence of intraocular lens: Secondary | ICD-10-CM | POA: Diagnosis not present

## 2017-09-07 DIAGNOSIS — H04123 Dry eye syndrome of bilateral lacrimal glands: Secondary | ICD-10-CM | POA: Diagnosis not present

## 2017-09-07 DIAGNOSIS — H401123 Primary open-angle glaucoma, left eye, severe stage: Secondary | ICD-10-CM | POA: Diagnosis not present

## 2017-11-09 DIAGNOSIS — H903 Sensorineural hearing loss, bilateral: Secondary | ICD-10-CM | POA: Diagnosis not present

## 2017-11-09 DIAGNOSIS — H6993 Unspecified Eustachian tube disorder, bilateral: Secondary | ICD-10-CM | POA: Insufficient documentation

## 2017-11-09 DIAGNOSIS — H900 Conductive hearing loss, bilateral: Secondary | ICD-10-CM | POA: Insufficient documentation

## 2017-11-09 DIAGNOSIS — H6983 Other specified disorders of Eustachian tube, bilateral: Secondary | ICD-10-CM | POA: Insufficient documentation

## 2017-11-24 DIAGNOSIS — H900 Conductive hearing loss, bilateral: Secondary | ICD-10-CM | POA: Diagnosis not present

## 2017-11-24 DIAGNOSIS — H6982 Other specified disorders of Eustachian tube, left ear: Secondary | ICD-10-CM | POA: Diagnosis not present

## 2017-12-09 DIAGNOSIS — H2511 Age-related nuclear cataract, right eye: Secondary | ICD-10-CM | POA: Diagnosis not present

## 2017-12-09 DIAGNOSIS — Z961 Presence of intraocular lens: Secondary | ICD-10-CM | POA: Diagnosis not present

## 2017-12-09 DIAGNOSIS — H401111 Primary open-angle glaucoma, right eye, mild stage: Secondary | ICD-10-CM | POA: Diagnosis not present

## 2017-12-09 DIAGNOSIS — H04123 Dry eye syndrome of bilateral lacrimal glands: Secondary | ICD-10-CM | POA: Diagnosis not present

## 2017-12-09 DIAGNOSIS — H401122 Primary open-angle glaucoma, left eye, moderate stage: Secondary | ICD-10-CM | POA: Diagnosis not present

## 2017-12-16 ENCOUNTER — Encounter: Payer: Medicare Other | Admitting: Adult Health

## 2017-12-16 ENCOUNTER — Encounter: Payer: Self-pay | Admitting: Adult Health

## 2017-12-16 ENCOUNTER — Ambulatory Visit (INDEPENDENT_AMBULATORY_CARE_PROVIDER_SITE_OTHER): Payer: Medicare Other | Admitting: Adult Health

## 2017-12-16 ENCOUNTER — Other Ambulatory Visit: Payer: Self-pay | Admitting: Adult Health

## 2017-12-16 VITALS — BP 160/70 | Temp 98.3°F | Ht 71.75 in | Wt 181.0 lb

## 2017-12-16 DIAGNOSIS — E785 Hyperlipidemia, unspecified: Secondary | ICD-10-CM

## 2017-12-16 DIAGNOSIS — Z0000 Encounter for general adult medical examination without abnormal findings: Secondary | ICD-10-CM

## 2017-12-16 DIAGNOSIS — Z125 Encounter for screening for malignant neoplasm of prostate: Secondary | ICD-10-CM | POA: Diagnosis not present

## 2017-12-16 DIAGNOSIS — I10 Essential (primary) hypertension: Secondary | ICD-10-CM

## 2017-12-16 DIAGNOSIS — F528 Other sexual dysfunction not due to a substance or known physiological condition: Secondary | ICD-10-CM

## 2017-12-16 DIAGNOSIS — E118 Type 2 diabetes mellitus with unspecified complications: Secondary | ICD-10-CM

## 2017-12-16 DIAGNOSIS — E119 Type 2 diabetes mellitus without complications: Secondary | ICD-10-CM

## 2017-12-16 LAB — CBC WITH DIFFERENTIAL/PLATELET
BASOS PCT: 0.4 % (ref 0.0–3.0)
Basophils Absolute: 0 10*3/uL (ref 0.0–0.1)
EOS ABS: 0.1 10*3/uL (ref 0.0–0.7)
Eosinophils Relative: 1.2 % (ref 0.0–5.0)
HEMATOCRIT: 42 % (ref 39.0–52.0)
Hemoglobin: 14.5 g/dL (ref 13.0–17.0)
LYMPHS PCT: 24.8 % (ref 12.0–46.0)
Lymphs Abs: 2.2 10*3/uL (ref 0.7–4.0)
MCHC: 34.6 g/dL (ref 30.0–36.0)
MCV: 91.6 fl (ref 78.0–100.0)
MONO ABS: 0.5 10*3/uL (ref 0.1–1.0)
Monocytes Relative: 5.5 % (ref 3.0–12.0)
NEUTROS ABS: 5.9 10*3/uL (ref 1.4–7.7)
Neutrophils Relative %: 68.1 % (ref 43.0–77.0)
PLATELETS: 268 10*3/uL (ref 150.0–400.0)
RBC: 4.59 Mil/uL (ref 4.22–5.81)
RDW: 12.8 % (ref 11.5–15.5)
WBC: 8.7 10*3/uL (ref 4.0–10.5)

## 2017-12-16 LAB — BASIC METABOLIC PANEL
BUN: 20 mg/dL (ref 6–23)
CHLORIDE: 102 meq/L (ref 96–112)
CO2: 28 meq/L (ref 19–32)
CREATININE: 1.16 mg/dL (ref 0.40–1.50)
Calcium: 9.4 mg/dL (ref 8.4–10.5)
GFR: 64.85 mL/min (ref >60.00)
Glucose, Bld: 149 mg/dL — ABNORMAL HIGH (ref 70–99)
POTASSIUM: 3.7 meq/L (ref 3.5–5.1)
Sodium: 140 mEq/L (ref 135–145)

## 2017-12-16 LAB — LIPID PANEL
CHOLESTEROL: 133 mg/dL (ref 0–200)
HDL: 43.5 mg/dL (ref >39.00)
LDL CALC: 74 mg/dL (ref 0–99)
NonHDL: 89.65
TRIGLYCERIDES: 80 mg/dL (ref 0.0–149.0)
Total CHOL/HDL Ratio: 3
VLDL: 16 mg/dL (ref 0.0–40.0)

## 2017-12-16 LAB — HEMOGLOBIN A1C: Hgb A1c MFr Bld: 6.7 % — ABNORMAL HIGH (ref 4.6–6.5)

## 2017-12-16 LAB — HEPATIC FUNCTION PANEL
ALT: 15 U/L (ref 0–53)
AST: 22 U/L (ref 0–37)
Albumin: 4.6 g/dL (ref 3.5–5.2)
Alkaline Phosphatase: 65 U/L (ref 39–117)
BILIRUBIN TOTAL: 0.8 mg/dL (ref 0.2–1.2)
Bilirubin, Direct: 0.2 mg/dL (ref 0.0–0.3)
Total Protein: 7.1 g/dL (ref 6.0–8.3)

## 2017-12-16 LAB — PSA: PSA: 2.96 ng/mL (ref 0.10–4.00)

## 2017-12-16 LAB — TSH: TSH: 1.37 u[IU]/mL (ref 0.35–4.50)

## 2017-12-16 MED ORDER — FUROSEMIDE 40 MG PO TABS: 40.0000 mg | ORAL_TABLET | Freq: Two times a day (BID) | ORAL | 3 refills | Status: DC

## 2017-12-16 MED ORDER — SIMVASTATIN 40 MG PO TABS: ORAL_TABLET | ORAL | 3 refills | Status: DC

## 2017-12-16 MED ORDER — AMLODIPINE BESYLATE 10 MG PO TABS: ORAL_TABLET | ORAL | 3 refills | Status: DC

## 2017-12-16 MED ORDER — LABETALOL HCL 200 MG PO TABS: 200.0000 mg | ORAL_TABLET | Freq: Two times a day (BID) | ORAL | 3 refills | Status: DC

## 2017-12-16 MED ORDER — CLONIDINE HCL 0.1 MG PO TABS: ORAL_TABLET | ORAL | 3 refills | Status: DC

## 2017-12-16 MED ORDER — GLUCOSE BLOOD VI STRP: ORAL_STRIP | 4 refills | Status: DC

## 2017-12-16 MED ORDER — METFORMIN HCL 500 MG PO TABS
ORAL_TABLET | ORAL | 2 refills | Status: DC
Start: 2017-12-16 — End: 2018-12-20

## 2017-12-16 MED ORDER — ACCU-CHEK FASTCLIX LANCETS MISC: 3 refills | Status: DC

## 2017-12-16 NOTE — Patient Instructions (Signed)
It was great seeing you today   Please follow up with me in 6 months for a diabetic check   Your next physical will be in one year   Please work on diet to help lower your blood sugars

## 2017-12-16 NOTE — Progress Notes (Signed)
Subjective:    Patient ID: Daniel Cochran, male    DOB: [birth year], 77 y.o.   MRN: [id number]  HPI  Patient presents for yearly preventative medicine examination. He is a pleasant 77 year old male who  has a past medical history of Diabetes mellitus ([place]), ED (erectile dysfunction), Hyperlipidemia, and Hypertension. He continues to farm    Essential Hypertension - controlled with clonidine 0.1 mg BID, labetalol 200 mg twice daily, amlodipine 10 mg, and Lasix 40 mg. He took his medication prior to arrival. Monitors his blood pressure at home and reports readings consistently in the 120-130's.  BP Readings from Last 3 Encounters:  12/16/17 (!) 160/70  12/15/16 (!) 155/80  05/05/16 (!) 152/88   DM -he takes metformin 500 mg daily and 250 in the evening.   Denies any episodes of hypoglycemia. Does not check his blood sugars at home.  Lab Results  Component Value Date   HGBA1C 6.7 (H) 12/15/2016   Hyperlipidemia -trolled with Zocor 40 mg QHS  Lab Results  Component Value Date   CHOL 121 12/15/2016   HDL 48.30 12/15/2016   LDLCALC 58 12/15/2016   TRIG 75.0 12/15/2016   CHOLHDL 3 12/15/2016    All immunizations and health maintenance protocols were reviewed with the patient and needed orders were placed.  Vaccinations are up-to-date  Appropriate screening laboratory values were ordered for the patient including screening of hyperlipidemia, renal function and hepatic function. If indicated by BPH, a PSA was ordered.  Medication reconciliation,  past medical history, social history, problem list and allergies were reviewed in detail with the patient  Goals were established with regard to weight loss, exercise, and  diet in compliance with medications. He stays active on the farm. He does not follow a diabetic diet.  Wt Readings from Last 3 Encounters:  12/16/17 181 lb (82.1 kg)  12/15/16 184 lb (83.5 kg)  05/05/16 187 lb 14.4 oz (85.2 kg)   End of life planning was discussed.   He has an advanced directive and living will  Participates in routine health maintenance items such as diabetic eye exams and dental screens.  Review of Systems  Constitutional: Negative.   HENT: Negative.   Eyes: Negative.   Respiratory: Negative.   Cardiovascular: Negative.   Gastrointestinal: Negative.   Endocrine: Negative.   Genitourinary: Negative.   Musculoskeletal: Positive for arthralgias and back pain.  Skin: Negative.   Allergic/Immunologic: Negative.   Neurological: Positive for numbness.  Hematological: Negative.   Psychiatric/Behavioral: Negative.   All other systems reviewed and are negative.  Past Medical History:  Diagnosis Date  . Diabetes mellitus ([place])   . ED (erectile dysfunction)   . Hyperlipidemia   . Hypertension     Social History   Socioeconomic History  . Marital status: Married    Spouse name: Not on file  . Number of children: Not on file  . Years of education: Not on file  . Highest education level: Not on file  Occupational History  . Not on file  Social Needs  . Financial resource strain: Not on file  . Food insecurity:    Worry: Not on file    Inability: Not on file  . Transportation needs:    Medical: Not on file    Non-medical: Not on file  Tobacco Use  . Smoking status: Never Smoker  . Smokeless tobacco: Never Used  Substance and Sexual Activity  . Alcohol use: No  . Drug use: No  .  Sexual activity: Not on file  Lifestyle  . Physical activity:    Days per week: Not on file    Minutes per session: Not on file  . Stress: Not on file  Relationships  . Social connections:    Talks on phone: Not on file    Gets together: Not on file    Attends religious service: Not on file    Active member of club or organization: Not on file    Attends meetings of clubs or organizations: Not on file    Relationship status: Not on file  . Intimate partner violence:    Fear of current or ex partner: Not on file    Emotionally abused:  Not on file    Physically abused: Not on file    Forced sexual activity: Not on file  Other Topics Concern  . Not on file  Social History Narrative  . Not on file    History reviewed. No pertinent surgical history.  Family History  Problem Relation Age of Onset  . Hyperlipidemia Other   . Hypertension Other     No Known Allergies  Current Outpatient Medications on File Prior to Visit  Medication Sig Dispense Refill  . ACCU-CHEK FASTCLIX LANCETS MISC Use once daily. Dx E11.9 100 each 3  . amLODipine (NORVASC) 10 MG tablet TAKE ONE TABLET BY MOUTH EVERY DAY 100 tablet 3  . aspirin 81 MG tablet Take 1 tablet (81 mg total) by mouth daily. 90 tablet 3  . cloNIDine (CATAPRES) 0.1 MG tablet TAKE ONE TABLET BY MOUTH TWICE DAILY 200 tablet 3  . furosemide (LASIX) 40 MG tablet Take 1 tablet (40 mg total) by mouth 2 (two) times daily. 200 tablet 3  . glucose blood (ACCU-CHEK AVIVA PLUS) test strip USE ONE STRIP TO CHECK GLUCOSE ONCE DAILY 100 each 4  . labetalol (NORMODYNE) 200 MG tablet TAKE ONE TABLET BY MOUTH TWICE DAILY 200 tablet 3  . latanoprost (XALATAN) 0.005 % ophthalmic solution     . metFORMIN (GLUCOPHAGE) 500 MG tablet TAKE ONE TABLET BY MOUTH BEFORE BREAKFAST AND ONE-HALF TABLET PRIOR TO YOUR EVENING MEAL 135 tablet 8  . sildenafil (REVATIO) 20 MG tablet Use as directed 20 tablet 10  . sildenafil (VIAGRA) 100 MG tablet Take 1 tablet (100 mg total) by mouth at bedtime as needed for erectile dysfunction (for erectile dysfunction). 10 tablet 10  . simvastatin (ZOCOR) 40 MG tablet TAKE ONE TABLET BY MOUTH EVERY DAY AT BEDTIME 100 tablet 3   No current facility-administered medications on file prior to visit.     BP (!) 160/70   Temp 98.3 F (36.8 C) (Oral)   Ht 5' 11.75" (1.822 m)   Wt 181 lb (82.1 kg)   BMI 24.72 kg/m       Objective:   Physical Exam  Constitutional: He is oriented to person, place, and time. He appears well-developed and well-nourished. No distress.    HENT:  Head: Normocephalic and atraumatic.  Right Ear: External ear normal.  Left Ear: External ear normal.  Nose: Nose normal.  Mouth/Throat: Oropharynx is clear and moist. No oropharyngeal exudate.  Eyes: Pupils are equal, round, and reactive to light. Conjunctivae and EOM are normal. Right eye exhibits no discharge. Left eye exhibits no discharge. No scleral icterus.  Neck: Normal range of motion. Neck supple. No JVD present. No tracheal deviation present. No thyromegaly present.  Cardiovascular: Normal rate, regular rhythm, normal heart sounds and intact distal pulses. Exam reveals no gallop  and no friction rub.  No murmur heard. Pulmonary/Chest: Effort normal and breath sounds normal. No stridor. No respiratory distress. He has no wheezes. He has no rales. He exhibits no tenderness.  Abdominal: Soft. Bowel sounds are normal. He exhibits no distension and no mass. There is no tenderness. There is no rebound and no guarding. No hernia.  Musculoskeletal: Normal range of motion. He exhibits no edema, tenderness or deformity.  Lymphadenopathy:    He has no cervical adenopathy.  Neurological: He is alert and oriented to person, place, and time. He displays normal reflexes. No cranial nerve deficit or sensory deficit. He exhibits normal muscle tone. Coordination normal.  Skin: Skin is warm and dry. Capillary refill takes less than 2 seconds. No rash noted. He is not diaphoretic. No erythema. No pallor.  Psychiatric: He has a normal mood and affect. His behavior is normal. Judgment and thought content normal.  Nursing note and vitals reviewed.     Assessment & Plan:  1. Routine general medical examination at a health care facility - Follow up in one year or sooner if needed - Basic metabolic panel - CBC with Differential/Platelet - Hemoglobin A1c - Hepatic function panel - Lipid panel - TSH  2. Hyperlipidemia, unspecified hyperlipidemia type - Consider increase in statin dose  - Heart  healthy diet  - Basic metabolic panel - CBC with Differential/Platelet - Hemoglobin A1c - Hepatic function panel - Lipid panel - TSH  3. Controlled type 2 diabetes mellitus with complication, without long-term current use of insulin ([place]) - Consider increase in Metformin  - Encouraged diabetic diet  - Follow up in 3-6 months depending on U9W  - Basic metabolic panel - CBC with Differential/Platelet - Hemoglobin A1c - Hepatic function panel - Lipid panel - TSH  4. Essential hypertension - Not at goal during office visit. Normal at home.  - No change in medication  - Basic metabolic panel - CBC with Differential/Platelet - Hemoglobin A1c - Hepatic function panel - Lipid panel - TSH  5. Prostate cancer screening  - PSA  Dorothyann Peng, NP

## 2017-12-22 DIAGNOSIS — H6983 Other specified disorders of Eustachian tube, bilateral: Secondary | ICD-10-CM | POA: Diagnosis not present

## 2018-03-02 ENCOUNTER — Other Ambulatory Visit: Payer: Self-pay | Admitting: Adult Health

## 2018-03-02 DIAGNOSIS — E119 Type 2 diabetes mellitus without complications: Secondary | ICD-10-CM

## 2018-05-31 ENCOUNTER — Other Ambulatory Visit: Payer: Self-pay

## 2018-05-31 NOTE — Patient Outreach (Signed)
[facility]) Care Management  05/31/2018  Daniel Cochran 1941-05-28 [id number]   Medication Adherence call to Mr. Daniel Cochran left a message for patient to call back patient is due on Simvastatin 40 mg and Metformin 500 mg.Daniel Cochran is showing past due under [employer].   [facility] Direct Dial [phone]  Fax [phone] [email or web address]

## 2018-06-01 ENCOUNTER — Other Ambulatory Visit: Payer: Self-pay

## 2018-06-01 NOTE — Patient Outreach (Signed)
[facility]) Care Management  06/01/2018  Daniel Cochran 03/14/1941 [id number]   Medication Adherence call back from Daniel Cochran patient is due on simvastatin 40 mg and Metfromin 500 mg patient ask if we can call [facility] an order both medication and he will pick up wants [facility] calls him to let him know there ready, patient is due under [place].   [facility] Direct Dial [phone]  Fax [phone] [name]

## 2018-06-15 DIAGNOSIS — H04123 Dry eye syndrome of bilateral lacrimal glands: Secondary | ICD-10-CM | POA: Diagnosis not present

## 2018-06-15 DIAGNOSIS — H401123 Primary open-angle glaucoma, left eye, severe stage: Secondary | ICD-10-CM | POA: Diagnosis not present

## 2018-06-15 DIAGNOSIS — H2511 Age-related nuclear cataract, right eye: Secondary | ICD-10-CM | POA: Diagnosis not present

## 2018-06-15 DIAGNOSIS — Z961 Presence of intraocular lens: Secondary | ICD-10-CM | POA: Diagnosis not present

## 2018-06-15 DIAGNOSIS — H401111 Primary open-angle glaucoma, right eye, mild stage: Secondary | ICD-10-CM | POA: Diagnosis not present

## 2018-06-15 LAB — HM DIABETES EYE EXAM

## 2018-06-20 ENCOUNTER — Ambulatory Visit: Payer: Medicare Other | Admitting: Family Medicine

## 2018-07-06 ENCOUNTER — Encounter: Payer: Self-pay | Admitting: Family Medicine

## 2018-08-16 ENCOUNTER — Encounter: Payer: Medicare Other | Admitting: Internal Medicine

## 2018-08-16 DIAGNOSIS — L570 Actinic keratosis: Secondary | ICD-10-CM | POA: Diagnosis not present

## 2018-08-16 DIAGNOSIS — L820 Inflamed seborrheic keratosis: Secondary | ICD-10-CM | POA: Diagnosis not present

## 2018-08-16 DIAGNOSIS — D225 Melanocytic nevi of trunk: Secondary | ICD-10-CM | POA: Diagnosis not present

## 2018-08-16 DIAGNOSIS — Z85828 Personal history of other malignant neoplasm of skin: Secondary | ICD-10-CM | POA: Diagnosis not present

## 2018-08-16 DIAGNOSIS — D485 Neoplasm of uncertain behavior of skin: Secondary | ICD-10-CM | POA: Diagnosis not present

## 2018-08-19 ENCOUNTER — Ambulatory Visit (INDEPENDENT_AMBULATORY_CARE_PROVIDER_SITE_OTHER): Payer: Medicare Other | Admitting: Internal Medicine

## 2018-08-19 ENCOUNTER — Encounter: Payer: Self-pay | Admitting: Internal Medicine

## 2018-08-19 VITALS — BP 150/80 | HR 54 | Temp 97.9°F | Wt 189.6 lb

## 2018-08-19 DIAGNOSIS — E119 Type 2 diabetes mellitus without complications: Secondary | ICD-10-CM | POA: Diagnosis not present

## 2018-08-19 DIAGNOSIS — I10 Essential (primary) hypertension: Secondary | ICD-10-CM | POA: Diagnosis not present

## 2018-08-19 DIAGNOSIS — E785 Hyperlipidemia, unspecified: Secondary | ICD-10-CM | POA: Diagnosis not present

## 2018-08-19 DIAGNOSIS — H409 Unspecified glaucoma: Secondary | ICD-10-CM | POA: Diagnosis not present

## 2018-08-19 LAB — POCT GLYCOSYLATED HEMOGLOBIN (HGB A1C): Hemoglobin A1C: 6.2 % — AB (ref 4.0–5.6)

## 2018-08-19 NOTE — Patient Instructions (Addendum)
-  Great meeting you today.  -Schedule an appt for an annual physical and medicare wellness visit for June 2020. Please come in fasting that day.  -Check you blood pressure at home 2-3 times a week and bring that log in to your next visit.

## 2018-08-19 NOTE — Progress Notes (Signed)
Established Patient Office Visit     CC/Reason for Visit: Establish care, follow up on chronic medical conditions  HPI: Daniel Cochran is a 78 y.o. male who is coming in today for the above mentioned reasons.Due for annual physocal in May 2020. Past Medical History is significant for: DM 2 that has been well controlled on metformin, hyperlipidemia on simvastatin, HTN that has not been well controlled on multiple medications as well as mild ED for which he takes PRN viagra. Also has left eye glaucoma and sees his eye MD on a biannual basis.  He has no acute complaints today. Due for A1c.   Past Medical/Surgical History: Past Medical History:  Diagnosis Date  . Diabetes mellitus ([place])   . ED (erectile dysfunction)   . Glaucoma   . Hyperlipidemia   . Hypertension     No past surgical history on file.  Social History:  reports that he has never smoked. He has never used smokeless tobacco. He reports that he does not drink alcohol or use drugs.  Allergies: No Known Allergies  Family History:  Family History  Problem Relation Age of Onset  . Hyperlipidemia Other   . Hypertension Other      Current Outpatient Medications:  .  ACCU-CHEK FASTCLIX LANCETS MISC, Use once daily. Dx E11.9, Disp: 100 each, Rfl: 3 .  amLODipine (NORVASC) 10 MG tablet, TAKE ONE TABLET BY MOUTH EVERY DAY, Disp: 90 tablet, Rfl: 3 .  aspirin 81 MG tablet, Take 1 tablet (81 mg total) by mouth daily., Disp: 90 tablet, Rfl: 3 .  cloNIDine (CATAPRES) 0.1 MG tablet, TAKE ONE TABLET BY MOUTH TWICE DAILY, Disp: 180 tablet, Rfl: 3 .  furosemide (LASIX) 40 MG tablet, Take 1 tablet (40 mg total) by mouth 2 (two) times daily., Disp: 180 tablet, Rfl: 3 .  glucose blood (ACCU-CHEK AVIVA PLUS) test strip, USE ONE STRIP TO CHECK GLUCOSE ONCE DAILY, Disp: 100 each, Rfl: 4 .  latanoprost (XALATAN) 0.005 % ophthalmic solution, , Disp: , Rfl:  .  metFORMIN (GLUCOPHAGE) 500 MG tablet, TAKE ONE TABLET BY MOUTH BEFORE  BREAKFAST AND ONE-HALF TABLET PRIOR TO YOUR EVENING MEAL, Disp: 135 tablet, Rfl: 2 .  metFORMIN (GLUCOPHAGE) 500 MG tablet, TAKE 1 TABLET BY MOUTH BEFORE BREAKFAST AND 1/2 (ONE-HALF) BEFORE YOUR EVENING MEAL., Disp: 135 tablet, Rfl: 8 .  sildenafil (REVATIO) 20 MG tablet, Use as directed, Disp: 20 tablet, Rfl: 10 .  sildenafil (VIAGRA) 100 MG tablet, Take 1 tablet (100 mg total) by mouth at bedtime as needed for erectile dysfunction (for erectile dysfunction)., Disp: 10 tablet, Rfl: 10 .  simvastatin (ZOCOR) 40 MG tablet, TAKE ONE TABLET BY MOUTH EVERY DAY AT BEDTIME, Disp: 90 tablet, Rfl: 3 .  labetalol (NORMODYNE) 200 MG tablet, Take 1 tablet (200 mg total) by mouth 2 (two) times daily. TAKE ONE TABLET BY MOUTH TWICE DAILY, Disp: 180 tablet, Rfl: 3  Review of Systems:  Constitutional: Denies fever, chills, diaphoresis, appetite change and fatigue.  HEENT: Denies photophobia, eye pain, redness, hearing loss, ear pain, congestion, sore throat, rhinorrhea, sneezing, mouth sores, trouble swallowing, neck pain, neck stiffness and tinnitus.   Respiratory: Denies SOB, DOE, cough, chest tightness,  and wheezing.   Cardiovascular: Denies chest pain, palpitations and leg swelling.  Gastrointestinal: Denies nausea, vomiting, abdominal pain, diarrhea, constipation, blood in stool and abdominal distention.  Genitourinary: Denies dysuria, urgency, frequency, hematuria, flank pain and difficulty urinating.  Endocrine: Denies: hot or cold intolerance, sweats, changes in  hair or nails, polyuria, polydipsia. Musculoskeletal: Denies myalgias, back pain, joint swelling, arthralgias and gait problem.  Skin: Denies pallor, rash and wound.  Neurological: Denies dizziness, seizures, syncope, weakness, light-headedness, numbness and headaches.  Hematological: Denies adenopathy. Easy bruising, personal or family bleeding history  Psychiatric/Behavioral: Denies suicidal ideation, mood changes, confusion, nervousness,  sleep disturbance and agitation    Physical Exam: Vitals:   08/19/18 1304  BP: (!) 150/80  Pulse: (!) 54  Temp: 97.9 F (36.6 C)  TempSrc: Oral  SpO2: 97%  Weight: 189 lb 9.6 oz (86 kg)    Body mass index is 25.89 kg/m.   Constitutional: NAD, calm, comfortable Eyes: PERRL, lids and conjunctivae normal ENMT: Mucous membranes are moist. Respiratory: clear to auscultation bilaterally, no wheezing, no crackles. Normal respiratory effort. No accessory muscle use.  Cardiovascular: Regular rate and rhythm, no murmurs / rubs / gallops. No extremity edema. 2+ pedal pulses. No carotid bruits.  Abdomen: no tenderness, no masses palpated. No hepatosplenomegaly. Bowel sounds positive.  Musculoskeletal: no clubbing / cyanosis. No joint deformity upper and lower extremities. Good ROM, no contractures. Normal muscle tone.  Psychiatric: Normal judgment and insight. Alert and oriented x 3. Normal mood.    Impression and Plan:  Controlled type 2 diabetes mellitus without complication, without long-term current use of insulin (HCC) -A1c today is 6.2. -Continue metformin.  Essential hypertension -BP is elevated. He states it is always high when he visits the doctor. -Advised ambulatory BP measurements which he will bring in to his next visit. -He is on amlodipine, labetalol, clonidine, lasix that he states he is compliant with.  Hyperlipidemia, unspecified hyperlipidemia type -Last LDL was 74 in 5/19. -On simvastatin 40.  Glaucoma of both eyes, unspecified glaucoma type -Will continue to follow recommendations from his eye MD and biannual visits.    Patient Instructions  -Great meeting you today.  -Schedule an appt for an annual physical and medicare wellness visit for June 2020. Please come in fasting that day.  -Check you blood pressure at home 2-3 times a week and bring that log in to your next visit.         Lelon Frohlich, MD [facility] at  [facility]

## 2018-09-13 DIAGNOSIS — L570 Actinic keratosis: Secondary | ICD-10-CM | POA: Diagnosis not present

## 2018-12-14 DIAGNOSIS — H401122 Primary open-angle glaucoma, left eye, moderate stage: Secondary | ICD-10-CM | POA: Diagnosis not present

## 2018-12-14 DIAGNOSIS — H401111 Primary open-angle glaucoma, right eye, mild stage: Secondary | ICD-10-CM | POA: Diagnosis not present

## 2018-12-14 DIAGNOSIS — E119 Type 2 diabetes mellitus without complications: Secondary | ICD-10-CM | POA: Diagnosis not present

## 2018-12-20 ENCOUNTER — Encounter: Payer: Self-pay | Admitting: Internal Medicine

## 2018-12-20 ENCOUNTER — Ambulatory Visit (INDEPENDENT_AMBULATORY_CARE_PROVIDER_SITE_OTHER): Payer: Medicare Other | Admitting: Internal Medicine

## 2018-12-20 ENCOUNTER — Other Ambulatory Visit: Payer: Self-pay

## 2018-12-20 ENCOUNTER — Encounter: Payer: Medicare Other | Admitting: Family Medicine

## 2018-12-20 ENCOUNTER — Other Ambulatory Visit: Payer: Self-pay | Admitting: Internal Medicine

## 2018-12-20 VITALS — BP 130/70 | HR 52 | Temp 98.1°F | Ht 71.5 in | Wt 180.1 lb

## 2018-12-20 DIAGNOSIS — E118 Type 2 diabetes mellitus with unspecified complications: Secondary | ICD-10-CM | POA: Diagnosis not present

## 2018-12-20 DIAGNOSIS — E119 Type 2 diabetes mellitus without complications: Secondary | ICD-10-CM

## 2018-12-20 DIAGNOSIS — H900 Conductive hearing loss, bilateral: Secondary | ICD-10-CM

## 2018-12-20 DIAGNOSIS — H409 Unspecified glaucoma: Secondary | ICD-10-CM

## 2018-12-20 DIAGNOSIS — E785 Hyperlipidemia, unspecified: Secondary | ICD-10-CM

## 2018-12-20 DIAGNOSIS — E559 Vitamin D deficiency, unspecified: Secondary | ICD-10-CM

## 2018-12-20 DIAGNOSIS — F528 Other sexual dysfunction not due to a substance or known physiological condition: Secondary | ICD-10-CM | POA: Diagnosis not present

## 2018-12-20 DIAGNOSIS — Z0000 Encounter for general adult medical examination without abnormal findings: Secondary | ICD-10-CM | POA: Diagnosis not present

## 2018-12-20 DIAGNOSIS — I10 Essential (primary) hypertension: Secondary | ICD-10-CM

## 2018-12-20 DIAGNOSIS — E538 Deficiency of other specified B group vitamins: Secondary | ICD-10-CM | POA: Insufficient documentation

## 2018-12-20 LAB — CBC WITH DIFFERENTIAL/PLATELET
Basophils Absolute: 0 10*3/uL (ref 0.0–0.1)
Basophils Relative: 0.2 % (ref 0.0–3.0)
Eosinophils Absolute: 0.2 10*3/uL (ref 0.0–0.7)
Eosinophils Relative: 2.1 % (ref 0.0–5.0)
HCT: 41.4 % (ref 39.0–52.0)
Hemoglobin: 14.7 g/dL (ref 13.0–17.0)
Lymphocytes Relative: 35.5 % (ref 12.0–46.0)
Lymphs Abs: 2.6 10*3/uL (ref 0.7–4.0)
MCHC: 35.6 g/dL (ref 30.0–36.0)
MCV: 91.4 fl (ref 78.0–100.0)
Monocytes Absolute: 0.5 10*3/uL (ref 0.1–1.0)
Monocytes Relative: 6.9 % (ref 3.0–12.0)
Neutro Abs: 4 10*3/uL (ref 1.4–7.7)
Neutrophils Relative %: 55.3 % (ref 43.0–77.0)
Platelets: 258 10*3/uL (ref 150.0–400.0)
RBC: 4.53 Mil/uL (ref 4.22–5.81)
RDW: 12.7 % (ref 11.5–15.5)
WBC: 7.3 10*3/uL (ref 4.0–10.5)

## 2018-12-20 LAB — COMPREHENSIVE METABOLIC PANEL
ALT: 11 U/L (ref 0–53)
AST: 19 U/L (ref 0–37)
Albumin: 4.6 g/dL (ref 3.5–5.2)
Alkaline Phosphatase: 74 U/L (ref 39–117)
BUN: 23 mg/dL (ref 6–23)
CO2: 30 mEq/L (ref 19–32)
Calcium: 9.3 mg/dL (ref 8.4–10.5)
Chloride: 100 mEq/L (ref 96–112)
Creatinine, Ser: 1.31 mg/dL (ref 0.40–1.50)
GFR: 52.88 mL/min — ABNORMAL LOW (ref >60.00)
Glucose, Bld: 128 mg/dL — ABNORMAL HIGH (ref 70–99)
Potassium: 3.8 mEq/L (ref 3.5–5.1)
Sodium: 139 mEq/L (ref 135–145)
Total Bilirubin: 0.9 mg/dL (ref 0.2–1.2)
Total Protein: 7 g/dL (ref 6.0–8.3)

## 2018-12-20 LAB — LIPID PANEL
Cholesterol: 113 mg/dL (ref 0–200)
HDL: 41.4 mg/dL (ref >39.00)
LDL Cholesterol: 54 mg/dL (ref 0–99)
NonHDL: 72.09
Total CHOL/HDL Ratio: 3
Triglycerides: 91 mg/dL (ref 0.0–149.0)
VLDL: 18.2 mg/dL (ref 0.0–40.0)

## 2018-12-20 LAB — VITAMIN D 25 HYDROXY (VIT D DEFICIENCY, FRACTURES): VITD: 27.04 ng/mL — ABNORMAL LOW (ref 30.00–100.00)

## 2018-12-20 LAB — VITAMIN B12: Vitamin B-12: 140 pg/mL — ABNORMAL LOW (ref 211–911)

## 2018-12-20 LAB — MICROALBUMIN / CREATININE URINE RATIO
Creatinine,U: 117.3 mg/dL
Microalb Creat Ratio: 1 mg/g (ref 0.0–30.0)
Microalb, Ur: 1.2 mg/dL (ref 0.0–1.9)

## 2018-12-20 LAB — HEMOGLOBIN A1C: Hgb A1c MFr Bld: 6.7 % — ABNORMAL HIGH (ref 4.6–6.5)

## 2018-12-20 LAB — TSH: TSH: 2.49 u[IU]/mL (ref 0.35–4.50)

## 2018-12-20 MED ORDER — METFORMIN HCL 500 MG PO TABS: ORAL_TABLET | ORAL | 1 refills | Status: DC

## 2018-12-20 MED ORDER — VITAMIN D (ERGOCALCIFEROL) 1.25 MG (50000 UNIT) PO CAPS: 50000.0000 [IU] | ORAL_CAPSULE | ORAL | 0 refills | Status: AC

## 2018-12-20 MED ORDER — SIMVASTATIN 40 MG PO TABS: ORAL_TABLET | ORAL | 1 refills | Status: DC

## 2018-12-20 MED ORDER — LABETALOL HCL 200 MG PO TABS: 200.0000 mg | ORAL_TABLET | Freq: Two times a day (BID) | ORAL | 1 refills | Status: DC

## 2018-12-20 MED ORDER — AMLODIPINE BESYLATE 10 MG PO TABS: ORAL_TABLET | ORAL | 1 refills | Status: DC

## 2018-12-20 MED ORDER — FUROSEMIDE 40 MG PO TABS: 40.0000 mg | ORAL_TABLET | Freq: Two times a day (BID) | ORAL | 1 refills | Status: DC

## 2018-12-20 MED ORDER — CLONIDINE HCL 0.1 MG PO TABS: ORAL_TABLET | ORAL | 1 refills | Status: DC

## 2018-12-20 NOTE — Patient Instructions (Addendum)
-Nice seeing you today!!  -Lab work today; will notify you when results are available.  -Remember your shingles vaccination series at the drug store.  -Schedule follow up in 3 months.   Preventive Care 78 Years and Older, Male Preventive care refers to lifestyle choices and visits with your health care provider that can promote health and wellness. What does preventive care include?   A yearly physical exam. This is also called an annual well check.  Dental exams once or twice a year.  Routine eye exams. Ask your health care provider how often you should have your eyes checked.  Personal lifestyle choices, including: ? Daily care of your teeth and gums. ? Regular physical activity. ? Eating a healthy diet. ? Avoiding tobacco and drug use. ? Limiting alcohol use. ? Practicing safe sex. ? Taking low doses of aspirin every day. ? Taking vitamin and mineral supplements as recommended by your health care provider. What happens during an annual well check? The services and screenings done by your health care provider during your annual well check will depend on your age, overall health, lifestyle risk factors, and family history of disease. Counseling Your health care provider may ask you questions about your:  Alcohol use.  Tobacco use.  Drug use.  Emotional well-being.  Home and relationship well-being.  Sexual activity.  Eating habits.  History of falls.  Memory and ability to understand (cognition).  Work and work Statistician. Screening You may have the following tests or measurements:  Height, weight, and BMI.  Blood pressure.  Lipid and cholesterol levels. These may be checked every 5 years, or more frequently if you are over 49 years old.  Skin check.  Lung cancer screening. You may have this screening every year starting at age 8 if you have a 30-pack-year history of smoking and currently smoke or have quit within the past 15 years.  Colorectal  cancer screening. All adults should have this screening starting at age 59 and continuing until age 93. You will have tests every 1-10 years, depending on your results and the type of screening test. People at increased risk should start screening at an earlier age. Screening tests may include: ? Guaiac-based fecal occult blood testing. ? Fecal immunochemical test (FIT). ? Stool DNA test. ? Virtual colonoscopy. ? Sigmoidoscopy. During this test, a flexible tube with a tiny camera (sigmoidoscope) is used to examine your rectum and lower colon. The sigmoidoscope is inserted through your anus into your rectum and lower colon. ? Colonoscopy. During this test, a long, thin, flexible tube with a tiny camera (colonoscope) is used to examine your entire colon and rectum.  Prostate cancer screening. Recommendations will vary depending on your family history and other risks.  Hepatitis C blood test.  Hepatitis B blood test.  Sexually transmitted disease (STD) testing.  Diabetes screening. This is done by checking your blood sugar (glucose) after you have not eaten for a while (fasting). You may have this done every 1-3 years.  Abdominal aortic aneurysm (AAA) screening. You may need this if you are a current or former smoker.  Osteoporosis. You may be screened starting at age 28 if you are at high risk. Talk with your health care provider about your test results, treatment options, and if necessary, the need for more tests. Vaccines Your health care provider may recommend certain vaccines, such as:  Influenza vaccine. This is recommended every year.  Tetanus, diphtheria, and acellular pertussis (Tdap, Td) vaccine. You may need a Td  booster every 10 years.  Varicella vaccine. You may need this if you have not been vaccinated.  Zoster vaccine. You may need this after age 24.  Measles, mumps, and rubella (MMR) vaccine. You may need at least one dose of MMR if you were born in 1957 or later. You  may also need a second dose.  Pneumococcal 13-valent conjugate (PCV13) vaccine. One dose is recommended after age 62.  Pneumococcal polysaccharide (PPSV23) vaccine. One dose is recommended after age 64.  Meningococcal vaccine. You may need this if you have certain conditions.  Hepatitis A vaccine. You may need this if you have certain conditions or if you travel or work in places where you may be exposed to hepatitis A.  Hepatitis B vaccine. You may need this if you have certain conditions or if you travel or work in places where you may be exposed to hepatitis B.  Haemophilus influenzae type b (Hib) vaccine. You may need this if you have certain risk factors. Talk to your health care provider about which screenings and vaccines you need and how often you need them. This information is not intended to replace advice given to you by your health care provider. Make sure you discuss any questions you have with your health care provider. Document Released: 08/02/2015 Document Revised: 08/26/2017 Document Reviewed: 05/07/2015 Elsevier Interactive Patient Education  2019 [employer].

## 2018-12-20 NOTE — Progress Notes (Signed)
Established Patient Office Visit     CC/Reason for Visit: Annual preventive exam and subsequent Medicare wellness visit  HPI: Daniel Cochran is a 78 y.o. male who is coming in today for the above mentioned reasons. Past Medical History is significant for: DM 2 that has been well controlled on metformin, hyperlipidemia on simvastatin, HTN that has been well controlled on multiple medications as well as mild ED for which he takes PRN viagra. Also has left eye glaucoma and sees his eye MD on a biannual basis.  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  He is a never smoker, used to be a heavy drinker but has not had any alcohol in about 20 years.  He needs refills of all of his medications today.  He is up-to-date on all required immunizations, have discussed shingles vaccination series today.  He has surpassed benefit of screening colonoscopy for colon cancer screening.   Past Medical/Surgical History: Past Medical History:  Diagnosis Date   Diabetes mellitus ([place])    ED (erectile dysfunction)    Glaucoma    Hyperlipidemia    Hypertension     No past surgical history on file.  Social History:  reports that he has never smoked. He has never used smokeless tobacco. He reports that he does not drink alcohol or use drugs.  Allergies: No Known Allergies  Family History:  Family History  Problem Relation Age of Onset   Hyperlipidemia Other    Hypertension Other      Current Outpatient Medications:    ACCU-CHEK FASTCLIX LANCETS MISC, Use once daily. Dx E11.9, Disp: 100 each, Rfl: 3   amLODipine (NORVASC) 10 MG tablet, TAKE ONE TABLET BY MOUTH EVERY DAY, Disp: 90 tablet, Rfl: 3   aspirin 81 MG tablet, Take 1 tablet (81 mg total) by mouth daily., Disp: 90 tablet, Rfl: 3   cloNIDine (CATAPRES) 0.1 MG tablet, TAKE ONE TABLET BY MOUTH TWICE DAILY, Disp: 180 tablet, Rfl: 3   furosemide (LASIX) 40 MG tablet,  Take 1 tablet (40 mg total) by mouth 2 (two) times daily., Disp: 180 tablet, Rfl: 3   glucose blood (ACCU-CHEK AVIVA PLUS) test strip, USE ONE STRIP TO CHECK GLUCOSE ONCE DAILY, Disp: 100 each, Rfl: 4   latanoprost (XALATAN) 0.005 % ophthalmic solution, , Disp: , Rfl:    metFORMIN (GLUCOPHAGE) 500 MG tablet, TAKE ONE TABLET BY MOUTH BEFORE BREAKFAST AND ONE-HALF TABLET PRIOR TO YOUR EVENING MEAL, Disp: 135 tablet, Rfl: 2   metFORMIN (GLUCOPHAGE) 500 MG tablet, TAKE 1 TABLET BY MOUTH BEFORE BREAKFAST AND 1/2 (ONE-HALF) BEFORE YOUR EVENING MEAL., Disp: 135 tablet, Rfl: 8   sildenafil (REVATIO) 20 MG tablet, Use as directed, Disp: 20 tablet, Rfl: 10   sildenafil (VIAGRA) 100 MG tablet, Take 1 tablet (100 mg total) by mouth at bedtime as needed for erectile dysfunction (for erectile dysfunction)., Disp: 10 tablet, Rfl: 10   simvastatin (ZOCOR) 40 MG tablet, TAKE ONE TABLET BY MOUTH EVERY DAY AT BEDTIME, Disp: 90 tablet, Rfl: 3   labetalol (NORMODYNE) 200 MG tablet, Take 1 tablet (200 mg total) by mouth 2 (two) times daily. TAKE ONE TABLET BY MOUTH TWICE DAILY, Disp: 180 tablet, Rfl: 3  Review of Systems:  Constitutional: Denies fever, chills, diaphoresis, appetite change and fatigue.  HEENT: Denies photophobia, eye pain, redness, hearing loss, ear pain, congestion, sore throat, rhinorrhea, sneezing, mouth sores, trouble swallowing, neck pain, neck stiffness  and tinnitus.   Respiratory: Denies SOB, DOE, cough, chest tightness,  and wheezing.   Cardiovascular: Denies chest pain, palpitations and leg swelling.  Gastrointestinal: Denies nausea, vomiting, abdominal pain, diarrhea, constipation, blood in stool and abdominal distention.  Genitourinary: Denies dysuria, urgency, frequency, hematuria, flank pain and difficulty urinating.  Endocrine: Denies: hot or cold intolerance, sweats, changes in hair or nails, polyuria, polydipsia. Musculoskeletal: Denies myalgias, back pain, joint swelling,  arthralgias and gait problem.  Skin: Denies pallor, rash and wound.  Neurological: Denies dizziness, seizures, syncope, weakness, light-headedness, numbness and headaches.  Hematological: Denies adenopathy. Easy bruising, personal or family bleeding history  Psychiatric/Behavioral: Denies suicidal ideation, mood changes, confusion, nervousness, sleep disturbance and agitation    Physical Exam: Vitals:   12/20/18 0750  BP: 130/70  Pulse: (!) 52  Temp: 98.1 F (36.7 C)  TempSrc: Oral  SpO2: 94%  Weight: 180 lb 1.6 oz (81.7 kg)  Height: 5' 11.5" (1.816 m)    Body mass index is 24.77 kg/m.   Constitutional: NAD, calm, comfortable Eyes: PERRL, lids and conjunctivae normal, wears corrective lenses ENMT: Mucous membranes are moist. Posterior pharynx clear of any exudate or lesions. Normal dentition. Tympanic membrane is pearly white, no erythema or bulging. Neck: normal, supple, no masses, no thyromegaly Respiratory: clear to auscultation bilaterally, no wheezing, no crackles. Normal respiratory effort. No accessory muscle use.  Cardiovascular: Regular rate and rhythm, no murmurs / rubs / gallops. No extremity edema. 2+ pedal pulses. No carotid bruits.  Abdomen: no tenderness, no masses palpated. No hepatosplenomegaly. Bowel sounds positive.  Musculoskeletal: no clubbing / cyanosis. No joint deformity upper and lower extremities. Good ROM, no contractures. Normal muscle tone.  Skin: no rashes, lesions, ulcers. No induration Neurologic: CN 2-12 grossly intact. Sensation intact, DTR normal. Strength 5/5 in all 4.  Psychiatric: Normal judgment and insight. Alert and oriented x 3. Normal mood.   Subsequent Medicare wellness visit   1. Risk factors, based on past  M,S,F -cardiovascular disease risk factors include diabetes, hypertension, age, sex, hyperlipidemia   2.  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  Depression/mood:  Mood is stable not depressed   4.  Hearing:  He  states his hearing is okay, but 2 years ago he had some hearing loss in the left ear that was corrected by placement of tube.   5.  ADL's: He is independent in all ADLs   6.  Fall risk:  Low risk   7.  Home safety: No problems identified   8.  Height weight, and visual acuity: Height and weight as per chart, visual acuity is 20/25 on the left, 20/40 on the right, together is 20/20   9.  Counseling:  We have discussed importance of healthy lifestyle including physical activity and food choices in order to decrease his risk of cardiovascular disease   10. Lab orders based on risk factors: Laboratory update will be reviewed   11. Referral :  None today   12. Care plan:  Continue medications, follow-up with me in 3 months   13. Cognitive assessment:  Cognitively intact   14. Screening: Patient provided with a written and personalized 5-10 year screening schedule in the AVS.   yes   15. Provider List Update:   PCP, ophthalmology  16. Advance Directives: Full code     Office Visit from 12/20/2018 in [place] at [facility] Total Score  3       Fall Risk  12/20/2018 08/19/2018 12/16/2017  10/28/2015 10/04/2014  Falls in the past year? 1 1 No Yes Yes  Number falls in past yr: 0 1 - 1 1  Injury with Fall? 1 1 - No No    Diabetic Foot Exam - Simple   Simple Foot Form Diabetic Foot exam was performed with the following findings:  Yes 12/20/2018  8:26 AM  Visual Inspection No deformities, no ulcerations, no other skin breakdown bilaterally:  Yes Sensation Testing Intact to touch and monofilament testing bilaterally:  Yes Pulse Check Posterior Tibialis and Dorsalis pulse intact bilaterally:  Yes Comments      Impression and Plan:  Encounter for preventive health examination -Up-to-date on required immunizations, he will obtain shingles vaccination at pharmacy. -He has surpassed benefit of cancer screening due to age. -He has routine eye and dental care. -We have  discussed healthy lifestyle.  Controlled type 2 diabetes mellitus with complication, without long-term current use of insulin ([place]), Chronic  -Check A1c today, continue metformin. -Check microalbumin. -Foot exam done and documented in epic.  Hyperlipidemia, unspecified hyperlipidemia type  -Last LDL was 74 in May 2019, he is on statin. -Recheck lipids today.  ERECTILE DYSFUNCTION -Uses as needed Viagra  Essential hypertension -Well-controlled today.  Glaucoma of both eyes, unspecified glaucoma type -Follow-up with ophthalmology as scheduled     Patient Instructions  -Nice seeing you today!!  -Lab work today; will notify you when results are available.  -Remember your shingles vaccination series at the drug store.  -Schedule follow up in 3 months.   Preventive Care 25 Years and Older, Male Preventive care refers to lifestyle choices and visits with your health care provider that can promote health and wellness. What does preventive care include?   A yearly physical exam. This is also called an annual well check.  Dental exams once or twice a year.  Routine eye exams. Ask your health care provider how often you should have your eyes checked.  Personal lifestyle choices, including: ? Daily care of your teeth and gums. ? Regular physical activity. ? Eating a healthy diet. ? Avoiding tobacco and drug use. ? Limiting alcohol use. ? Practicing safe sex. ? Taking low doses of aspirin every day. ? Taking vitamin and mineral supplements as recommended by your health care provider. What happens during an annual well check? The services and screenings done by your health care provider during your annual well check will depend on your age, overall health, lifestyle risk factors, and family history of disease. Counseling Your health care provider may ask you questions about your:  Alcohol use.  Tobacco use.  Drug use.  Emotional well-being.  Home and relationship  well-being.  Sexual activity.  Eating habits.  History of falls.  Memory and ability to understand (cognition).  Work and work Statistician. Screening You may have the following tests or measurements:  Height, weight, and BMI.  Blood pressure.  Lipid and cholesterol levels. These may be checked every 5 years, or more frequently if you are over 68 years old.  Skin check.  Lung cancer screening. You may have this screening every year starting at age 86 if you have a 30-pack-year history of smoking and currently smoke or have quit within the past 15 years.  Colorectal cancer screening. All adults should have this screening starting at age 12 and continuing until age 37. You will have tests every 1-10 years, depending on your results and the type of screening test. People at increased risk should start screening at an earlier  age. Screening tests may include: ? Guaiac-based fecal occult blood testing. ? Fecal immunochemical test (FIT). ? Stool DNA test. ? Virtual colonoscopy. ? Sigmoidoscopy. During this test, a flexible tube with a tiny camera (sigmoidoscope) is used to examine your rectum and lower colon. The sigmoidoscope is inserted through your anus into your rectum and lower colon. ? Colonoscopy. During this test, a long, thin, flexible tube with a tiny camera (colonoscope) is used to examine your entire colon and rectum.  Prostate cancer screening. Recommendations will vary depending on your family history and other risks.  Hepatitis C blood test.  Hepatitis B blood test.  Sexually transmitted disease (STD) testing.  Diabetes screening. This is done by checking your blood sugar (glucose) after you have not eaten for a while (fasting). You may have this done every 1-3 years.  Abdominal aortic aneurysm (AAA) screening. You may need this if you are a current or former smoker.  Osteoporosis. You may be screened starting at age 55 if you are at high risk. Talk with your  health care provider about your test results, treatment options, and if necessary, the need for more tests. Vaccines Your health care provider may recommend certain vaccines, such as:  Influenza vaccine. This is recommended every year.  Tetanus, diphtheria, and acellular pertussis (Tdap, Td) vaccine. You may need a Td booster every 10 years.  Varicella vaccine. You may need this if you have not been vaccinated.  Zoster vaccine. You may need this after age 56.  Measles, mumps, and rubella (MMR) vaccine. You may need at least one dose of MMR if you were born in 1957 or later. You may also need a second dose.  Pneumococcal 13-valent conjugate (PCV13) vaccine. One dose is recommended after age 48.  Pneumococcal polysaccharide (PPSV23) vaccine. One dose is recommended after age 38.  Meningococcal vaccine. You may need this if you have certain conditions.  Hepatitis A vaccine. You may need this if you have certain conditions or if you travel or work in places where you may be exposed to hepatitis A.  Hepatitis B vaccine. You may need this if you have certain conditions or if you travel or work in places where you may be exposed to hepatitis B.  Haemophilus influenzae type b (Hib) vaccine. You may need this if you have certain risk factors. Talk to your health care provider about which screenings and vaccines you need and how often you need them. This information is not intended to replace advice given to you by your health care provider. Make sure you discuss any questions you have with your health care provider. Document Released: 08/02/2015 Document Revised: 08/26/2017 Document Reviewed: 05/07/2015 Elsevier Interactive Patient Education  2019 [place], [place] [facility]

## 2018-12-30 ENCOUNTER — Other Ambulatory Visit: Payer: Self-pay

## 2018-12-30 ENCOUNTER — Ambulatory Visit (INDEPENDENT_AMBULATORY_CARE_PROVIDER_SITE_OTHER): Payer: Medicare Other | Admitting: *Deleted

## 2018-12-30 DIAGNOSIS — E538 Deficiency of other specified B group vitamins: Secondary | ICD-10-CM | POA: Diagnosis not present

## 2018-12-30 MED ORDER — CYANOCOBALAMIN 1000 MCG/ML IJ SOLN
1000.0000 ug | Freq: Once | INTRAMUSCULAR | Status: AC
  Administered 2018-12-30: 1000 ug via INTRAMUSCULAR

## 2019-01-06 ENCOUNTER — Ambulatory Visit (INDEPENDENT_AMBULATORY_CARE_PROVIDER_SITE_OTHER): Payer: Medicare Other | Admitting: *Deleted

## 2019-01-06 ENCOUNTER — Other Ambulatory Visit: Payer: Self-pay

## 2019-01-06 DIAGNOSIS — E538 Deficiency of other specified B group vitamins: Secondary | ICD-10-CM

## 2019-01-06 MED ORDER — CYANOCOBALAMIN 1000 MCG/ML IJ SOLN
1000.0000 ug | Freq: Once | INTRAMUSCULAR | Status: AC
  Administered 2019-01-06: 1000 ug via INTRAMUSCULAR

## 2019-01-06 NOTE — Progress Notes (Signed)
Patient in for B-12 injection. B-12 injection administered with no reactions.

## 2019-01-13 ENCOUNTER — Other Ambulatory Visit: Payer: Self-pay

## 2019-01-13 ENCOUNTER — Ambulatory Visit (INDEPENDENT_AMBULATORY_CARE_PROVIDER_SITE_OTHER): Payer: Medicare Other

## 2019-01-13 DIAGNOSIS — E538 Deficiency of other specified B group vitamins: Secondary | ICD-10-CM | POA: Diagnosis not present

## 2019-01-13 MED ORDER — CYANOCOBALAMIN 1000 MCG/ML IJ SOLN
1000.0000 ug | Freq: Once | INTRAMUSCULAR | Status: AC
  Administered 2019-01-13: 1000 ug via INTRAMUSCULAR

## 2019-01-13 NOTE — Progress Notes (Signed)
Per orders of Dr. Hernandez, injection of B12 given by Ralphie Lovelady. Patient tolerated injection well.  

## 2019-01-25 ENCOUNTER — Other Ambulatory Visit: Payer: Self-pay

## 2019-01-25 ENCOUNTER — Ambulatory Visit (INDEPENDENT_AMBULATORY_CARE_PROVIDER_SITE_OTHER): Payer: Medicare Other | Admitting: *Deleted

## 2019-01-25 DIAGNOSIS — E538 Deficiency of other specified B group vitamins: Secondary | ICD-10-CM | POA: Diagnosis not present

## 2019-01-25 MED ORDER — CYANOCOBALAMIN 1000 MCG/ML IJ SOLN: INTRAMUSCULAR | 11 refills | Status: DC

## 2019-01-25 MED ORDER — "BD SAFETYGLIDE SYRINGE/NEEDLE 25G X 1"" 3 ML MISC": 11 refills | Status: DC

## 2019-01-25 MED ORDER — CYANOCOBALAMIN 1000 MCG/ML IJ SOLN
1000.0000 ug | Freq: Once | INTRAMUSCULAR | Status: AC
  Administered 2019-01-25: 1000 ug via INTRAMUSCULAR

## 2019-01-25 NOTE — Progress Notes (Signed)
Per orders of Cory Nafziger NP, injection of B12 given by Berdene Askari. Patient tolerated injection well. 

## 2019-02-07 NOTE — Progress Notes (Signed)
Patient came to the office for a b12 injection

## 2019-03-22 ENCOUNTER — Other Ambulatory Visit: Payer: Self-pay

## 2019-03-22 ENCOUNTER — Encounter: Payer: Self-pay | Admitting: Internal Medicine

## 2019-03-22 ENCOUNTER — Ambulatory Visit (INDEPENDENT_AMBULATORY_CARE_PROVIDER_SITE_OTHER): Payer: Medicare Other | Admitting: Internal Medicine

## 2019-03-22 VITALS — BP 110/70 | HR 69 | Temp 97.8°F | Wt 174.9 lb

## 2019-03-22 DIAGNOSIS — E559 Vitamin D deficiency, unspecified: Secondary | ICD-10-CM

## 2019-03-22 DIAGNOSIS — E118 Type 2 diabetes mellitus with unspecified complications: Secondary | ICD-10-CM

## 2019-03-22 DIAGNOSIS — F528 Other sexual dysfunction not due to a substance or known physiological condition: Secondary | ICD-10-CM | POA: Diagnosis not present

## 2019-03-22 DIAGNOSIS — Z23 Encounter for immunization: Secondary | ICD-10-CM

## 2019-03-22 DIAGNOSIS — E538 Deficiency of other specified B group vitamins: Secondary | ICD-10-CM | POA: Diagnosis not present

## 2019-03-22 DIAGNOSIS — H409 Unspecified glaucoma: Secondary | ICD-10-CM

## 2019-03-22 DIAGNOSIS — E785 Hyperlipidemia, unspecified: Secondary | ICD-10-CM | POA: Diagnosis not present

## 2019-03-22 DIAGNOSIS — I10 Essential (primary) hypertension: Secondary | ICD-10-CM

## 2019-03-22 LAB — POCT GLYCOSYLATED HEMOGLOBIN (HGB A1C): Hemoglobin A1C: 6.2 % — AB (ref 4.0–5.6)

## 2019-03-22 MED ORDER — CYANOCOBALAMIN 1000 MCG/ML IJ SOLN
1000.0000 ug | Freq: Once | INTRAMUSCULAR | Status: AC
  Administered 2019-03-22: 1000 ug via INTRAMUSCULAR

## 2019-03-22 NOTE — Addendum Note (Signed)
Addended by: Westley Hummer B on: 03/22/2019 08:34 AM   Modules accepted: Orders

## 2019-03-22 NOTE — Progress Notes (Signed)
Established Patient Office Visit     CC/Reason for Visit: 26-month follow-up chronic conditions  HPI: Daniel Cochran is a 78 y.o. male who is coming in today for the above mentioned reasons. Past Medical History is significant for: Type 2 diabetes, hyperlipidemia, hypertension all of which are very well controlled.  He also has a history of left eye glaucoma who follows with his ophthalmologist routinely.  He has mild erectile dysfunction for which he uses sildenafil as needed.  During his physical in June he was diagnosed with vitamin D and vitamin B12 deficiency and is currently being replaced.  He is due for monthly B12 injection today and for recheck of his vitamin D levels.  He states that since starting the vitamin replacement he feels "much better and with more energy".  He has no acute complaints today.   Past Medical/Surgical History: Past Medical History:  Diagnosis Date  . Diabetes mellitus ([place])   . ED (erectile dysfunction)   . Glaucoma   . Hyperlipidemia   . Hypertension     No past surgical history on file.  Social History:  reports that he has never smoked. He has never used smokeless tobacco. He reports that he does not drink alcohol or use drugs.  Allergies: No Known Allergies  Family History:  Family History  Problem Relation Age of Onset  . Hyperlipidemia Other   . Hypertension Other      Current Outpatient Medications:  .  ACCU-CHEK FASTCLIX LANCETS MISC, Use once daily. Dx E11.9, Disp: 100 each, Rfl: 3 .  amLODipine (NORVASC) 10 MG tablet, TAKE ONE TABLET BY MOUTH EVERY DAY, Disp: 90 tablet, Rfl: 1 .  aspirin 81 MG tablet, Take 1 tablet (81 mg total) by mouth daily., Disp: 90 tablet, Rfl: 3 .  cloNIDine (CATAPRES) 0.1 MG tablet, TAKE ONE TABLET BY MOUTH TWICE DAILY, Disp: 180 tablet, Rfl: 1 .  cyanocobalamin (,VITAMIN B-12,) 1000 MCG/ML injection, Inject 1 ml into the deltoid once weekly for 4 weeks.  Then inject 1 ml once monthly thereafter.,  Disp: 1 mL, Rfl: 11 .  furosemide (LASIX) 40 MG tablet, Take 1 tablet (40 mg total) by mouth 2 (two) times daily., Disp: 180 tablet, Rfl: 1 .  glucose blood (ACCU-CHEK AVIVA PLUS) test strip, USE ONE STRIP TO CHECK GLUCOSE ONCE DAILY, Disp: 100 each, Rfl: 4 .  latanoprost (XALATAN) 0.005 % ophthalmic solution, , Disp: , Rfl:  .  metFORMIN (GLUCOPHAGE) 500 MG tablet, TAKE 1 TABLET BY MOUTH BEFORE BREAKFAST AND 1/2 (ONE-HALF) BEFORE YOUR EVENING MEAL., Disp: 135 tablet, Rfl: 8 .  metFORMIN (GLUCOPHAGE) 500 MG tablet, TAKE ONE TABLET BY MOUTH BEFORE BREAKFAST AND ONE-HALF TABLET PRIOR TO YOUR EVENING MEAL, Disp: 135 tablet, Rfl: 1 .  sildenafil (REVATIO) 20 MG tablet, Use as directed, Disp: 20 tablet, Rfl: 10 .  sildenafil (VIAGRA) 100 MG tablet, Take 1 tablet (100 mg total) by mouth at bedtime as needed for erectile dysfunction (for erectile dysfunction)., Disp: 10 tablet, Rfl: 10 .  simvastatin (ZOCOR) 40 MG tablet, TAKE ONE TABLET BY MOUTH EVERY DAY AT BEDTIME, Disp: 90 tablet, Rfl: 1 .  SYRINGE-NEEDLE, DISP, 3 ML (BD SAFETYGLIDE SYRINGE/NEEDLE) 25G X 1" 3 ML MISC, Use for B12 injections, Disp: 100 each, Rfl: 11 .  labetalol (NORMODYNE) 200 MG tablet, Take 1 tablet (200 mg total) by mouth 2 (two) times daily. TAKE ONE TABLET BY MOUTH TWICE DAILY, Disp: 180 tablet, Rfl: 1  Review of Systems:  Constitutional: Denies  fever, chills, diaphoresis, appetite change and fatigue.  HEENT: Denies photophobia, eye pain, redness, hearing loss, ear pain, congestion, sore throat, rhinorrhea, sneezing, mouth sores, trouble swallowing, neck pain, neck stiffness and tinnitus.   Respiratory: Denies SOB, DOE, cough, chest tightness,  and wheezing.   Cardiovascular: Denies chest pain, palpitations and leg swelling.  Gastrointestinal: Denies nausea, vomiting, abdominal pain, diarrhea, constipation, blood in stool and abdominal distention.  Genitourinary: Denies dysuria, urgency, frequency, hematuria, flank pain and  difficulty urinating.  Endocrine: Denies: hot or cold intolerance, sweats, changes in hair or nails, polyuria, polydipsia. Musculoskeletal: Denies myalgias, back pain, joint swelling, arthralgias and gait problem.  Skin: Denies pallor, rash and wound.  Neurological: Denies dizziness, seizures, syncope, weakness, light-headedness, numbness and headaches.  Hematological: Denies adenopathy. Easy bruising, personal or family bleeding history  Psychiatric/Behavioral: Denies suicidal ideation, mood changes, confusion, nervousness, sleep disturbance and agitation    Physical Exam: Vitals:   03/22/19 0807  BP: 110/70  Pulse: 69  Temp: 97.8 F (36.6 C)  TempSrc: Temporal  SpO2: 94%  Weight: 174 lb 14.4 oz (79.3 kg)    Body mass index is 24.05 kg/m.   Constitutional: NAD, calm, comfortable Eyes: PERRL, lids and conjunctivae normal, wears corrective lenses ENMT: Mucous membranes are moist.  Respiratory: clear to auscultation bilaterally, no wheezing, no crackles. Normal respiratory effort. No accessory muscle use.  Cardiovascular: Regular rate and rhythm, no murmurs / rubs / gallops. No extremity edema. 2+ pedal pulses. No carotid bruits.  Abdomen: no tenderness, no masses palpated. No hepatosplenomegaly. Bowel sounds positive.  Musculoskeletal: no clubbing / cyanosis. No joint deformity upper and lower extremities. Good ROM, no contractures. Normal muscle tone.  Skin: no rashes, lesions, ulcers. No induration Neurologic: Grossly intact and nonfocal Psychiatric: Normal judgment and insight. Alert and oriented x 3. Normal mood.    Impression and Plan:  Controlled type 2 diabetes mellitus with complication, without long-term current use of insulin (HCC)  -A1c today 6.2 demonstrating excellent control.  Needs flu shot  - Plan: Flu Vaccine QUAD High Dose(Fluad)  Hyperlipidemia, unspecified hyperlipidemia type -Most recent LDL in June 2020 was 54.  ERECTILE DYSFUNCTION -Continue PRN  sildenafil.  Essential hypertension -Very well controlled on current regimen.  Vitamin D deficiency -He has completed 12 weeks of high-dose supplementation. -Recheck vitamin D levels today.  Vitamin B12 deficiency -Due for monthly injections today.  Glaucoma of both eyes, unspecified glaucoma type -Continue follow-up with ophthalmology as scheduled by them.   Patient Instructions  -Nice seeing you today!!  -Lab work today; will notify you once results are available.  -Flu vaccine today.  -B12 injection today.  -Make sure you schedule a nurse visit next month and in 2 months for your B12.  -Schedule follow up with me in 3 months.       Lelon Frohlich, MD [facility]

## 2019-03-22 NOTE — Patient Instructions (Signed)
-  Nice seeing you today!!  -Lab work today; will notify you once results are available.  -Flu vaccine today.  -B12 injection today.  -Make sure you schedule a nurse visit next month and in 2 months for your B12.  -Schedule follow up with me in 3 months.

## 2019-03-24 ENCOUNTER — Other Ambulatory Visit: Payer: Self-pay

## 2019-03-24 NOTE — Patient Outreach (Signed)
[facility]) Care Management  03/24/2019  TEVAUGHN SANTIZO 12-Aug-1940 [id number]   Medication Adherence call to Mr. Yousuf Whack [facility] Identifiers Verify spoke with patient he is past due on Metformin 500 mg and Simvastatin 40 mg patient explain he is taking both medication patient some metformin for 2 more weeks patient needs Simvastatin he ask to call [facility] an [facility] for [facility] to pick up. Mr. Bellman is showing past due under [facility] [place].  [facility] Direct Dial [phone]  Fax [phone] [email or web address]

## 2019-04-21 ENCOUNTER — Ambulatory Visit (INDEPENDENT_AMBULATORY_CARE_PROVIDER_SITE_OTHER): Payer: Medicare Other | Admitting: *Deleted

## 2019-04-21 ENCOUNTER — Other Ambulatory Visit: Payer: Self-pay

## 2019-04-21 DIAGNOSIS — E538 Deficiency of other specified B group vitamins: Secondary | ICD-10-CM | POA: Diagnosis not present

## 2019-04-21 MED ORDER — CYANOCOBALAMIN 1000 MCG/ML IJ SOLN
1000.0000 ug | Freq: Once | INTRAMUSCULAR | Status: AC
  Administered 2019-04-21: 1000 ug via INTRAMUSCULAR

## 2019-04-21 NOTE — Progress Notes (Signed)
Per orders of Dr. Hernandez, injection of Cyanocobalamin 1000mcg given by Ameia Morency A. Patient tolerated injection well. 

## 2019-05-19 ENCOUNTER — Telehealth: Payer: Self-pay | Admitting: *Deleted

## 2019-05-19 NOTE — Telephone Encounter (Signed)
Copied from [place] [id number]. Topic: General - Other >> May 19, 2019 10:44 AM Jodie Echevaria wrote: Reason for CRM: Lorriane Shire with [facility] called to get an update of medical clearance form that was faxed over on 05/04/2019 and on 05/15/2019 please contact Vanessa at Ph# [phone]

## 2019-05-22 ENCOUNTER — Ambulatory Visit (INDEPENDENT_AMBULATORY_CARE_PROVIDER_SITE_OTHER): Payer: Medicare Other

## 2019-05-22 ENCOUNTER — Other Ambulatory Visit: Payer: Self-pay

## 2019-05-22 DIAGNOSIS — E538 Deficiency of other specified B group vitamins: Secondary | ICD-10-CM | POA: Diagnosis not present

## 2019-05-22 MED ORDER — CYANOCOBALAMIN 1000 MCG/ML IJ SOLN
1000.0000 ug | Freq: Once | INTRAMUSCULAR | Status: AC
  Administered 2019-05-22: 1000 ug via INTRAMUSCULAR

## 2019-05-22 MED ORDER — CYANOCOBALAMIN 1000 MCG/ML IJ SOLN: 1000.0000 ug | INTRAMUSCULAR | Status: DC

## 2019-05-22 NOTE — Telephone Encounter (Signed)
Katharine Look called from [facility] Surgery checking status of medical clearance.    Sandra [phone] Ext [phone]

## 2019-05-22 NOTE — Progress Notes (Signed)
Pt came in for his B-12 injection. Per Dr. Jerilee Hoh pt is to obtain this injection once a month. "Vit B12 def: weekly IM for 4 weeks and monthly thereafter"  Pt tolerated injection well.

## 2019-05-23 NOTE — Telephone Encounter (Signed)
Order faxed and confirmed.

## 2019-05-23 NOTE — Telephone Encounter (Signed)
Form has been completed.

## 2019-05-24 NOTE — Telephone Encounter (Signed)
Daniel Cochran requesting call back from [facility] to discuss additional notes that were not received that were needed for clearance.

## 2019-05-25 NOTE — Telephone Encounter (Signed)
Katharine Look calling.  States that they still need OV notes, lab work, and a medication list. Fax number:  [phone]

## 2019-05-26 NOTE — Telephone Encounter (Signed)
Records were sent e-fax

## 2019-06-19 ENCOUNTER — Other Ambulatory Visit: Payer: Self-pay

## 2019-06-20 ENCOUNTER — Ambulatory Visit: Payer: Medicare Other | Admitting: Internal Medicine

## 2019-06-21 ENCOUNTER — Other Ambulatory Visit: Payer: Self-pay

## 2019-06-21 DIAGNOSIS — Z20822 Contact with and (suspected) exposure to covid-19: Secondary | ICD-10-CM

## 2019-06-24 LAB — NOVEL CORONAVIRUS, NAA: SARS-CoV-2, NAA: DETECTED — AB

## 2019-06-25 ENCOUNTER — Telehealth: Payer: Self-pay | Admitting: Unknown Physician Specialty

## 2019-06-25 NOTE — Telephone Encounter (Signed)
Called about Covid symptoms and monoclonal ab treatment if applicable.  No answer

## 2019-06-26 ENCOUNTER — Other Ambulatory Visit: Payer: Self-pay | Admitting: Internal Medicine

## 2019-06-26 DIAGNOSIS — I10 Essential (primary) hypertension: Secondary | ICD-10-CM

## 2019-07-11 ENCOUNTER — Other Ambulatory Visit: Payer: Self-pay

## 2019-07-12 ENCOUNTER — Ambulatory Visit: Payer: Medicare Other | Admitting: Internal Medicine

## 2019-08-02 ENCOUNTER — Other Ambulatory Visit: Payer: Self-pay | Admitting: Internal Medicine

## 2019-08-02 DIAGNOSIS — E119 Type 2 diabetes mellitus without complications: Secondary | ICD-10-CM

## 2019-08-11 ENCOUNTER — Ambulatory Visit (INDEPENDENT_AMBULATORY_CARE_PROVIDER_SITE_OTHER): Payer: Medicare Other | Admitting: Internal Medicine

## 2019-08-11 ENCOUNTER — Other Ambulatory Visit: Payer: Self-pay

## 2019-08-11 ENCOUNTER — Encounter: Payer: Self-pay | Admitting: Internal Medicine

## 2019-08-11 VITALS — BP 130/80 | HR 97 | Temp 97.6°F | Wt 174.5 lb

## 2019-08-11 DIAGNOSIS — H409 Unspecified glaucoma: Secondary | ICD-10-CM | POA: Diagnosis not present

## 2019-08-11 DIAGNOSIS — E538 Deficiency of other specified B group vitamins: Secondary | ICD-10-CM

## 2019-08-11 DIAGNOSIS — I10 Essential (primary) hypertension: Secondary | ICD-10-CM

## 2019-08-11 DIAGNOSIS — E785 Hyperlipidemia, unspecified: Secondary | ICD-10-CM

## 2019-08-11 DIAGNOSIS — E559 Vitamin D deficiency, unspecified: Secondary | ICD-10-CM

## 2019-08-11 DIAGNOSIS — E118 Type 2 diabetes mellitus with unspecified complications: Secondary | ICD-10-CM | POA: Diagnosis not present

## 2019-08-11 LAB — POCT GLYCOSYLATED HEMOGLOBIN (HGB A1C): Hemoglobin A1C: 6.3 % — AB (ref 4.0–5.6)

## 2019-08-11 MED ORDER — CYANOCOBALAMIN 1000 MCG/ML IJ SOLN
1000.0000 ug | Freq: Once | INTRAMUSCULAR | Status: AC
  Administered 2019-08-11: 17:00:00 1000 ug via INTRAMUSCULAR

## 2019-08-11 NOTE — Patient Instructions (Signed)
-Nice seeing you today!!  -Keep up the good work.  -B12 injection in office today.  -Schedule follow up in 3 months.   We are committed to keeping you informed about the COVID-19 vaccine.  As the vaccine continues to become available for each phase, we will ensure that patients who meet the criteria receive the information they need to access vaccination opportunities. Continue to check your MyChart account and [email or web address] for updates. Please review the Phase 1b information below.  Following [place]'s guidelines for the distribution of COVID-19 vaccines we are pleased to share our plans to begin offering vaccines to those 65 and older (Phase 1b). Here are details of those plans:  [place] On Tuesday, Jan. 19, the [facility]) and [place] begin large-scale COVID-19 vaccinations at the [facility]. The vaccinations are appointment only and for those 82 and older.  Walk-ins will not be accepted.  All appointments are currently filled. Please join our waiting list for the next available appointments. We will contact you when appointments become available. Please do not sign up more than once.  Join Our Waiting List   Other Vaccination Opportunities in [place] We are also working in partnership with county health agencies in our service counties to ensure continuing vaccination availability in the weeks and months ahead. Learn more about each county's vaccination efforts in the website links below:   [place]'s phase 1b vaccination guidelines, prioritizing those 65 and over as the next eligible group to receive the COVID-19 vaccine, are detailed at [email or web address].   Vaccine Safety and Effectiveness Clinical trials for the Pfizer COVID-19 vaccine involved 42,000 people and  showed that the vaccine is more than 95% effective in preventing COVID-19 with no serious safety concerns. Similar results have been reported for the Moderna COVID-19 vaccine. Side effects reported in the [place] clinical trials include a sore arm at the injection site, fatigue, headache, chills and fever. While side effects from the [place] COVID-19 vaccine are higher than for a typical flu vaccine, they are lower in many ways than side effects from the leading vaccine to prevent shingles. Side effects are signs that a vaccine is working and are related to your immune system being stimulated to produce antibodies against infection. Side effects from vaccination are far less significant than health impacts from COVID-19.  Staying Informed Pharmacists, infectious disease doctors, critical care nurses and other experts at [facility] continue to speak publicly through media interviews and direct communication with our patients and communities about the safety, effectiveness and importance of vaccines to eliminate COVID-19. In addition, reliable information on vaccine safety, effectiveness, side effects and more is available on the following websites:  [place] Department of Health and Human Services COVID-19 Vaccine Information Website.  U.S. Centers for Disease Control and Prevention [id number] Human resources officer.  Staying Safe We agree with the CDC on what we can do to help our communities get back to normal: Getting "back to normal" is going to take all of our tools. If we use all the tools we have, we stand the best chance of getting our families, communities, schools and workplaces "back to normal" sooner:  Get vaccinated as soon as vaccines become available within the phase of the state's vaccination rollout plan for which you meet the eligibility criteria.  Wear a mask.  Stay 6 feet from  others and avoid crowds.  Wash hands often.  For our most current information, please visit  [email or web address].

## 2019-08-11 NOTE — Addendum Note (Signed)
Addended by: Westley Hummer B on: 08/11/2019 04:34 PM   Modules accepted: Orders

## 2019-08-11 NOTE — Progress Notes (Signed)
Established Patient Office Visit     This visit occurred during the SARS-CoV-2 public health emergency.  Safety protocols were in place, including screening questions prior to the visit, additional usage of staff PPE, and extensive cleaning of exam room while observing appropriate contact time as indicated for disinfecting solutions.    CC/Reason for Visit: 60-month follow-up chronic medical conditions  HPI: Daniel Cochran is a 79 y.o. male who is coming in today for the above mentioned reasons. Past Medical History is significant for: Type 2 diabetes, hyperlipidemia, hypertension all of which are very well controlled.  He also has a history of left eye glaucoma who follows with his ophthalmologist routinely.  He has mild erectile dysfunction for which he uses sildenafil as needed.  During his physical in June he was diagnosed with vitamin D and vitamin B12 deficiency and is currently being replaced.  He is due for monthly B12 injection today.  He has no acute complaints.  He did test positive for Covid on December 15.  He did not require hospitalization and was able to manage at home with over-the-counter meds.   Past Medical/Surgical History: Past Medical History:  Diagnosis Date  . Diabetes mellitus ([place])   . ED (erectile dysfunction)   . Glaucoma   . Hyperlipidemia   . Hypertension     No past surgical history on file.  Social History:  reports that he has never smoked. He has never used smokeless tobacco. He reports that he does not drink alcohol or use drugs.  Allergies: No Known Allergies  Family History:  Family History  Problem Relation Age of Onset  . Hyperlipidemia Other   . Hypertension Other      Current Outpatient Medications:  .  ACCU-CHEK FASTCLIX LANCETS MISC, Use once daily. Dx E11.9, Disp: 100 each, Rfl: 3 .  amLODipine (NORVASC) 10 MG tablet, Take 1 tablet by mouth once daily, Disp: 90 tablet, Rfl: 0 .  aspirin 81 MG tablet, Take 1 tablet (81 mg  total) by mouth daily., Disp: 90 tablet, Rfl: 3 .  cloNIDine (CATAPRES) 0.1 MG tablet, TAKE ONE TABLET BY MOUTH TWICE DAILY, Disp: 180 tablet, Rfl: 1 .  cyanocobalamin (,VITAMIN B-12,) 1000 MCG/ML injection, Inject 1 ml into the deltoid once weekly for 4 weeks.  Then inject 1 ml once monthly thereafter., Disp: 1 mL, Rfl: 11 .  furosemide (LASIX) 40 MG tablet, Take 1 tablet (40 mg total) by mouth 2 (two) times daily., Disp: 180 tablet, Rfl: 1 .  glucose blood (ACCU-CHEK AVIVA PLUS) test strip, USE ONE STRIP TO CHECK GLUCOSE ONCE DAILY, Disp: 100 each, Rfl: 4 .  latanoprost (XALATAN) 0.005 % ophthalmic solution, , Disp: , Rfl:  .  metFORMIN (GLUCOPHAGE) 500 MG tablet, TAKE 1 TABLET BY MOUTH BEFORE BREAKFAST AND 1/2 (ONE-HALF) BEFORE YOUR EVENING MEAL., Disp: 135 tablet, Rfl: 8 .  metFORMIN (GLUCOPHAGE) 500 MG tablet, TAKE 1 TABLET BY MOUTH BEFORE BREAKFAST AND 1/2 (ONE-HALF) TABLET PRIOR TO YOUR EVENING MEAL, Disp: 135 tablet, Rfl: 0 .  sildenafil (REVATIO) 20 MG tablet, Use as directed, Disp: 20 tablet, Rfl: 10 .  sildenafil (VIAGRA) 100 MG tablet, Take 1 tablet (100 mg total) by mouth at bedtime as needed for erectile dysfunction (for erectile dysfunction)., Disp: 10 tablet, Rfl: 10 .  simvastatin (ZOCOR) 40 MG tablet, TAKE ONE TABLET BY MOUTH EVERY DAY AT BEDTIME, Disp: 90 tablet, Rfl: 1 .  SYRINGE-NEEDLE, DISP, 3 ML (BD SAFETYGLIDE SYRINGE/NEEDLE) 25G X 1" 3 ML  MISC, Use for B12 injections, Disp: 100 each, Rfl: 11 .  labetalol (NORMODYNE) 200 MG tablet, Take 1 tablet (200 mg total) by mouth 2 (two) times daily. TAKE ONE TABLET BY MOUTH TWICE DAILY, Disp: 180 tablet, Rfl: 1  Review of Systems:  Constitutional: Denies fever, chills, diaphoresis, appetite change and fatigue.  HEENT: Denies photophobia, eye pain, redness, hearing loss, ear pain, congestion, sore throat, rhinorrhea, sneezing, mouth sores, trouble swallowing, neck pain, neck stiffness and tinnitus.   Respiratory: Denies SOB, DOE, cough,  chest tightness,  and wheezing.   Cardiovascular: Denies chest pain, palpitations and leg swelling.  Gastrointestinal: Denies nausea, vomiting, abdominal pain, diarrhea, constipation, blood in stool and abdominal distention.  Genitourinary: Denies dysuria, urgency, frequency, hematuria, flank pain and difficulty urinating.  Endocrine: Denies: hot or cold intolerance, sweats, changes in hair or nails, polyuria, polydipsia. Musculoskeletal: Denies myalgias, back pain, joint swelling, arthralgias and gait problem.  Skin: Denies pallor, rash and wound.  Neurological: Denies dizziness, seizures, syncope, weakness, light-headedness, numbness and headaches.  Hematological: Denies adenopathy. Easy bruising, personal or family bleeding history  Psychiatric/Behavioral: Denies suicidal ideation, mood changes, confusion, nervousness, sleep disturbance and agitation    Physical Exam: Vitals:   08/11/19 1003  BP: 130/80  Pulse: 97  Temp: 97.6 F (36.4 C)  TempSrc: Temporal  SpO2: 99%  Weight: 174 lb 8 oz (79.2 kg)    Body mass index is 24 kg/m.   Constitutional: NAD, calm, comfortable Eyes: PERRL, lids and conjunctivae normal, wears corrective lenses ENMT: Mucous membranes are moist.  Respiratory: clear to auscultation bilaterally, no wheezing, no crackles. Normal respiratory effort. No accessory muscle use.  Cardiovascular: Regular rate and rhythm, no murmurs / rubs / gallops. No extremity edema.  Neurologic: Grossly intact and nonfocal Psychiatric: Normal judgment and insight. Alert and oriented x 3. Normal mood.    Impression and Plan:  Controlled type 2 diabetes mellitus with complication, without long-term current use of insulin ([place])  -Well-controlled with an A1c of 6.3 today.  Continue current Metformin dose.  Vitamin D deficiency -On supplementation  Vitamin B12 deficiency -To receive monthly IM injection today  Glaucoma of both eyes, unspecified glaucoma type -Follow-up  with ophthalmology.  Hyperlipidemia, unspecified hyperlipidemia type -Last LDL was 54 in June 2020, continue simvastatin.  Essential hypertension -Well-controlled on current regimen.    Patient Instructions  -Nice seeing you today!!  -Keep up the good work.  -B12 injection in office today.  -Schedule follow up in 3 months.   We are committed to keeping you informed about the COVID-19 vaccine.  As the vaccine continues to become available for each phase, we will ensure that patients who meet the criteria receive the information they need to access vaccination opportunities. Continue to check your MyChart account and [email or web address] for updates. Please review the Phase 1b information below.  Following [place]'s guidelines for the distribution of COVID-19 vaccines we are pleased to share our plans to begin offering vaccines to those 65 and older (Phase 1b). Here are details of those plans:  [place] On Tuesday, Jan. 19, the [facility]) and [place] begin large-scale COVID-19 vaccinations at the [facility]. The vaccinations are appointment only and for those 38 and older.  Walk-ins will not be accepted.  All appointments are currently filled. Please join our waiting list for the next available appointments. We will contact you when appointments become available. Please do not sign  up more than once.  Join Our Waiting List   Other Vaccination Opportunities in [place] We are also working in partnership with county health agencies in our service counties to ensure continuing vaccination availability in the weeks and months ahead. Learn more about each county's vaccination efforts in the website links below:   [place] [employer]'s phase 1b vaccination guidelines, prioritizing those 65 and  over as the next eligible group to receive the COVID-19 vaccine, are detailed at [email or web address].   Vaccine Safety and Effectiveness Clinical trials for the Pfizer COVID-19 vaccine involved 42,000 people and showed that the vaccine is more than 95% effective in preventing COVID-19 with no serious safety concerns. Similar results have been reported for the Moderna COVID-19 vaccine. Side effects reported in the [place] clinical trials include a sore arm at the injection site, fatigue, headache, chills and fever. While side effects from the [place] COVID-19 vaccine are higher than for a typical flu vaccine, they are lower in many ways than side effects from the leading vaccine to prevent shingles. Side effects are signs that a vaccine is working and are related to your immune system being stimulated to produce antibodies against infection. Side effects from vaccination are far less significant than health impacts from COVID-19.  Staying Informed Pharmacists, infectious disease doctors, critical care nurses and other experts at [facility] continue to speak publicly through media interviews and direct communication with our patients and communities about the safety, effectiveness and importance of vaccines to eliminate COVID-19. In addition, reliable information on vaccine safety, effectiveness, side effects and more is available on the following websites:  [place] Department of Health and Human Services COVID-19 Vaccine Information Website.  U.S. Centers for Disease Control and Prevention XX123456 Human resources officer.  Staying Safe We agree with the CDC on what we can do to help our communities get back to normal: Getting "back to normal" is going to take all of our tools. If we use all the tools we have, we stand the best chance of getting our families, communities, schools and workplaces "back to normal" sooner:  Get vaccinated as soon as vaccines become available within the phase of  the state's vaccination rollout plan for which you meet the eligibility criteria.  Wear a mask.  Stay 6 feet from others and avoid crowds.  Wash hands often.  For our most current information, please visit [email or web address].      Lelon Frohlich, MD [facility] at [facility]

## 2019-08-18 ENCOUNTER — Other Ambulatory Visit: Payer: Self-pay | Admitting: Internal Medicine

## 2019-08-18 DIAGNOSIS — I10 Essential (primary) hypertension: Secondary | ICD-10-CM

## 2019-08-21 DIAGNOSIS — L820 Inflamed seborrheic keratosis: Secondary | ICD-10-CM | POA: Diagnosis not present

## 2019-08-21 DIAGNOSIS — B079 Viral wart, unspecified: Secondary | ICD-10-CM | POA: Diagnosis not present

## 2019-08-21 DIAGNOSIS — D485 Neoplasm of uncertain behavior of skin: Secondary | ICD-10-CM | POA: Diagnosis not present

## 2019-08-21 DIAGNOSIS — D1801 Hemangioma of skin and subcutaneous tissue: Secondary | ICD-10-CM | POA: Diagnosis not present

## 2019-08-21 DIAGNOSIS — L570 Actinic keratosis: Secondary | ICD-10-CM | POA: Diagnosis not present

## 2019-08-21 DIAGNOSIS — D225 Melanocytic nevi of trunk: Secondary | ICD-10-CM | POA: Diagnosis not present

## 2019-08-21 DIAGNOSIS — L905 Scar conditions and fibrosis of skin: Secondary | ICD-10-CM | POA: Diagnosis not present

## 2019-08-21 DIAGNOSIS — L821 Other seborrheic keratosis: Secondary | ICD-10-CM | POA: Diagnosis not present

## 2019-08-28 ENCOUNTER — Other Ambulatory Visit: Payer: Self-pay | Admitting: Internal Medicine

## 2019-08-28 DIAGNOSIS — I10 Essential (primary) hypertension: Secondary | ICD-10-CM

## 2019-09-08 ENCOUNTER — Other Ambulatory Visit: Payer: Self-pay

## 2019-09-11 ENCOUNTER — Other Ambulatory Visit: Payer: Self-pay

## 2019-09-11 ENCOUNTER — Ambulatory Visit (INDEPENDENT_AMBULATORY_CARE_PROVIDER_SITE_OTHER): Payer: Medicare Other

## 2019-09-11 DIAGNOSIS — E538 Deficiency of other specified B group vitamins: Secondary | ICD-10-CM

## 2019-09-11 MED ORDER — CYANOCOBALAMIN 1000 MCG/ML IJ SOLN
1000.0000 ug | Freq: Once | INTRAMUSCULAR | Status: AC
  Administered 2019-09-11: 1000 ug via INTRAMUSCULAR

## 2019-09-11 NOTE — Progress Notes (Signed)
Per orders of Dr. Hernandez , injection of Cyanocobalamin 1,000 mcg/mL given by Matie Dimaano N Imelda Dandridge. °Patient tolerated injection well. ° °

## 2019-10-06 ENCOUNTER — Other Ambulatory Visit: Payer: Self-pay

## 2019-10-09 ENCOUNTER — Other Ambulatory Visit: Payer: Self-pay

## 2019-10-09 ENCOUNTER — Other Ambulatory Visit: Payer: Self-pay | Admitting: Internal Medicine

## 2019-10-09 ENCOUNTER — Ambulatory Visit (INDEPENDENT_AMBULATORY_CARE_PROVIDER_SITE_OTHER): Payer: Medicare Other | Admitting: *Deleted

## 2019-10-09 DIAGNOSIS — E538 Deficiency of other specified B group vitamins: Secondary | ICD-10-CM

## 2019-10-09 DIAGNOSIS — I10 Essential (primary) hypertension: Secondary | ICD-10-CM

## 2019-10-09 MED ORDER — CYANOCOBALAMIN 1000 MCG/ML IJ SOLN
1000.0000 ug | Freq: Once | INTRAMUSCULAR | Status: AC
  Administered 2019-10-09: 1000 ug via INTRAMUSCULAR

## 2019-10-09 NOTE — Progress Notes (Signed)
Per orders of Dr. Burchette, injection of Cyanocobalamin 1000mcg given by Debhora Titus A. Patient tolerated injection well.  

## 2019-11-04 ENCOUNTER — Other Ambulatory Visit: Payer: Self-pay | Admitting: Internal Medicine

## 2019-11-04 DIAGNOSIS — E119 Type 2 diabetes mellitus without complications: Secondary | ICD-10-CM

## 2019-11-06 ENCOUNTER — Other Ambulatory Visit: Payer: Self-pay | Admitting: Internal Medicine

## 2019-11-06 DIAGNOSIS — E785 Hyperlipidemia, unspecified: Secondary | ICD-10-CM

## 2019-11-08 ENCOUNTER — Other Ambulatory Visit: Payer: Self-pay

## 2019-11-09 ENCOUNTER — Other Ambulatory Visit: Payer: Self-pay | Admitting: Internal Medicine

## 2019-11-09 ENCOUNTER — Encounter: Payer: Self-pay | Admitting: Internal Medicine

## 2019-11-09 ENCOUNTER — Ambulatory Visit (INDEPENDENT_AMBULATORY_CARE_PROVIDER_SITE_OTHER): Payer: Medicare Other | Admitting: Internal Medicine

## 2019-11-09 VITALS — BP 150/80 | HR 50 | Temp 97.8°F | Wt 176.8 lb

## 2019-11-09 DIAGNOSIS — H409 Unspecified glaucoma: Secondary | ICD-10-CM

## 2019-11-09 DIAGNOSIS — I10 Essential (primary) hypertension: Secondary | ICD-10-CM | POA: Diagnosis not present

## 2019-11-09 DIAGNOSIS — E559 Vitamin D deficiency, unspecified: Secondary | ICD-10-CM

## 2019-11-09 DIAGNOSIS — F528 Other sexual dysfunction not due to a substance or known physiological condition: Secondary | ICD-10-CM

## 2019-11-09 DIAGNOSIS — E538 Deficiency of other specified B group vitamins: Secondary | ICD-10-CM

## 2019-11-09 DIAGNOSIS — E118 Type 2 diabetes mellitus with unspecified complications: Secondary | ICD-10-CM

## 2019-11-09 DIAGNOSIS — E785 Hyperlipidemia, unspecified: Secondary | ICD-10-CM | POA: Diagnosis not present

## 2019-11-09 LAB — POCT GLYCOSYLATED HEMOGLOBIN (HGB A1C): Hemoglobin A1C: 6.4 % — AB (ref 4.0–5.6)

## 2019-11-09 LAB — VITAMIN D 25 HYDROXY (VIT D DEFICIENCY, FRACTURES): VITD: 25.39 ng/mL — ABNORMAL LOW (ref 30.00–100.00)

## 2019-11-09 MED ORDER — VITAMIN D (ERGOCALCIFEROL) 1.25 MG (50000 UNIT) PO CAPS: 50000.0000 [IU] | ORAL_CAPSULE | ORAL | 0 refills | Status: AC

## 2019-11-09 MED ORDER — CYANOCOBALAMIN 1000 MCG/ML IJ SOLN
1000.0000 ug | Freq: Once | INTRAMUSCULAR | Status: AC
  Administered 2019-11-09: 1000 ug via INTRAMUSCULAR

## 2019-11-09 NOTE — Addendum Note (Signed)
Addended by: Elmer Picker on: 11/09/2019 09:25 AM   Modules accepted: Orders

## 2019-11-09 NOTE — Progress Notes (Signed)
Established Patient Office Visit     This visit occurred during the SARS-CoV-2 public health emergency.  Safety protocols were in place, including screening questions prior to the visit, additional usage of staff PPE, and extensive cleaning of exam room while observing appropriate contact time as indicated for disinfecting solutions.    CC/Reason for Visit: 64-month follow-up of chronic conditions  HPI: Daniel Cochran is a 79 y.o. male who is coming in today for the above mentioned reasons. Past Medical History is significant for: Type 2 diabetes, hyperlipidemia, hypertension all of which are very well controlled. He also has a history of left eye glaucoma who follows with his ophthalmologist routinely. He has mild erectile dysfunction for which he uses sildenafil as needed. During his physical in June he was diagnosed with vitamin D and vitamin B12 deficiency and is currently being replaced. He is due for monthly B12 injection today.  He has no acute complaints.  He has received both Covid vaccines.   Past Medical/Surgical History: Past Medical History:  Diagnosis Date  . Diabetes mellitus ([place])   . ED (erectile dysfunction)   . Glaucoma   . Hyperlipidemia   . Hypertension     No past surgical history on file.  Social History:  reports that he has never smoked. He has never used smokeless tobacco. He reports that he does not drink alcohol or use drugs.  Allergies: No Known Allergies  Family History:  Family History  Problem Relation Age of Onset  . Hyperlipidemia Other   . Hypertension Other      Current Outpatient Medications:  .  ACCU-CHEK FASTCLIX LANCETS MISC, Use once daily. Dx E11.9, Disp: 100 each, Rfl: 3 .  amLODipine (NORVASC) 10 MG tablet, Take 1 tablet by mouth once daily, Disp: 90 tablet, Rfl: 0 .  aspirin 81 MG tablet, Take 1 tablet (81 mg total) by mouth daily., Disp: 90 tablet, Rfl: 3 .  cloNIDine (CATAPRES) 0.1 MG tablet, Take 1 tablet by mouth  twice daily, Disp: 180 tablet, Rfl: 1 .  cyanocobalamin (,VITAMIN B-12,) 1000 MCG/ML injection, Inject 1 ml into the deltoid once weekly for 4 weeks.  Then inject 1 ml once monthly thereafter., Disp: 1 mL, Rfl: 11 .  furosemide (LASIX) 40 MG tablet, Take 1 tablet (40 mg total) by mouth 2 (two) times daily., Disp: 180 tablet, Rfl: 1 .  glucose blood (ACCU-CHEK AVIVA PLUS) test strip, USE ONE STRIP TO CHECK GLUCOSE ONCE DAILY, Disp: 100 each, Rfl: 4 .  labetalol (NORMODYNE) 200 MG tablet, Take 1 tablet by mouth twice daily, Disp: 180 tablet, Rfl: 0 .  latanoprost (XALATAN) 0.005 % ophthalmic solution, , Disp: , Rfl:  .  metFORMIN (GLUCOPHAGE) 500 MG tablet, TAKE 1 TABLET BY MOUTH BEFORE BREAKFAST AND 1/2 (ONE-HALF) BEFORE YOUR EVENING MEAL., Disp: 135 tablet, Rfl: 8 .  metFORMIN (GLUCOPHAGE) 500 MG tablet, TAKE ONE TABLET BY MOUTH BEFORE BREAKFAST AND 1/2 TABLET PRIOR TO YOUR EVENING MEAL, Disp: 135 tablet, Rfl: 0 .  sildenafil (REVATIO) 20 MG tablet, Use as directed, Disp: 20 tablet, Rfl: 10 .  sildenafil (VIAGRA) 100 MG tablet, Take 1 tablet (100 mg total) by mouth at bedtime as needed for erectile dysfunction (for erectile dysfunction)., Disp: 10 tablet, Rfl: 10 .  simvastatin (ZOCOR) 40 MG tablet, TAKE 1 TABLET BY MOUTH ONCE DAILY AT BEDTIME, Disp: 90 tablet, Rfl: 0 .  SYRINGE-NEEDLE, DISP, 3 ML (BD SAFETYGLIDE SYRINGE/NEEDLE) 25G X 1" 3 ML MISC, Use for B12 injections,  Disp: 100 each, Rfl: 11  Review of Systems:  Constitutional: Denies fever, chills, diaphoresis, appetite change and fatigue.  HEENT: Denies photophobia, eye pain, redness, hearing loss, ear pain, congestion, sore throat, rhinorrhea, sneezing, mouth sores, trouble swallowing, neck pain, neck stiffness and tinnitus.   Respiratory: Denies SOB, DOE, cough, chest tightness,  and wheezing.   Cardiovascular: Denies chest pain, palpitations and leg swelling.  Gastrointestinal: Denies nausea, vomiting, abdominal pain, diarrhea,  constipation, blood in stool and abdominal distention.  Genitourinary: Denies dysuria, urgency, frequency, hematuria, flank pain and difficulty urinating.  Endocrine: Denies: hot or cold intolerance, sweats, changes in hair or nails, polyuria, polydipsia. Musculoskeletal: Denies myalgias, back pain, joint swelling, arthralgias and gait problem.  Skin: Denies pallor, rash and wound.  Neurological: Denies dizziness, seizures, syncope, weakness, light-headedness, numbness and headaches.  Hematological: Denies adenopathy. Easy bruising, personal or family bleeding history  Psychiatric/Behavioral: Denies suicidal ideation, mood changes, confusion, nervousness, sleep disturbance and agitation    Physical Exam: Vitals:   11/09/19 0856  BP: (!) 150/80  Pulse: (!) 50  Temp: 97.8 F (36.6 C)  TempSrc: Temporal  SpO2: 95%  Weight: 176 lb 12.8 oz (80.2 kg)    Body mass index is 24.31 kg/m.   Constitutional: NAD, calm, comfortable Eyes: PERRL, lids and conjunctivae normal, wears corrective lenses ENMT: Mucous membranes are moist.  Respiratory: clear to auscultation bilaterally, no wheezing, no crackles. Normal respiratory effort. No accessory muscle use.  Cardiovascular: Regular rate and rhythm, no murmurs / rubs / gallops. No extremity edema.  Neurologic: Grossly intact and nonfocal Psychiatric: Normal judgment and insight. Alert and oriented x 3. Normal mood.    Impression and Plan:  Controlled type 2 diabetes mellitus with complication, without long-term current use of insulin ([place])  -Well-controlled with an A1c of 6.4 today.  Vitamin D deficiency -Completed 12 weeks of high-dose supplementation, will recheck today.  Vitamin B12 deficiency -Due for monthly IM injection today.  Essential hypertension -Blood pressure is elevated in office today to 150/80, he believes it is because he had an extra cup of coffee this morning. -He will do ambulatory blood pressure monitoring and  return for follow-up, his blood pressure has not been elevated before. -He is compliant with medications and low-salt diet.  Hyperlipidemia, unspecified hyperlipidemia type -Last LDL was 54 in June 2020, he continues on simvastatin.  Glaucoma of both eyes, unspecified glaucoma type -Followed by ophthalmology  ERECTILE DYSFUNCTION -Uses as needed sildenafil.   Patient Instructions  -Nice seeing you today!!  -Lab work today; will notify you once results are available.  -B12 injection in office today.  -See you back in 3 months.       Lelon Frohlich, MD [facility] at [facility]

## 2019-11-09 NOTE — Addendum Note (Signed)
Addended by: Westley Hummer B on: 11/09/2019 11:51 AM   Modules accepted: Orders

## 2019-11-09 NOTE — Patient Instructions (Signed)
-  Nice seeing you today!!  -Lab work today; will notify you once results are available.  -B12 injection in office today.  -See you back in 3 months.

## 2019-11-28 DIAGNOSIS — H04123 Dry eye syndrome of bilateral lacrimal glands: Secondary | ICD-10-CM | POA: Diagnosis not present

## 2019-11-28 DIAGNOSIS — H401111 Primary open-angle glaucoma, right eye, mild stage: Secondary | ICD-10-CM | POA: Diagnosis not present

## 2019-11-28 DIAGNOSIS — H2511 Age-related nuclear cataract, right eye: Secondary | ICD-10-CM | POA: Diagnosis not present

## 2019-11-28 DIAGNOSIS — Z961 Presence of intraocular lens: Secondary | ICD-10-CM | POA: Diagnosis not present

## 2019-11-28 DIAGNOSIS — H401123 Primary open-angle glaucoma, left eye, severe stage: Secondary | ICD-10-CM | POA: Diagnosis not present

## 2019-12-05 ENCOUNTER — Other Ambulatory Visit: Payer: Self-pay | Admitting: Internal Medicine

## 2019-12-05 DIAGNOSIS — I10 Essential (primary) hypertension: Secondary | ICD-10-CM

## 2019-12-29 ENCOUNTER — Other Ambulatory Visit: Payer: Self-pay | Admitting: Internal Medicine

## 2019-12-29 DIAGNOSIS — I10 Essential (primary) hypertension: Secondary | ICD-10-CM

## 2020-02-08 ENCOUNTER — Other Ambulatory Visit: Payer: Self-pay

## 2020-02-08 ENCOUNTER — Ambulatory Visit (INDEPENDENT_AMBULATORY_CARE_PROVIDER_SITE_OTHER): Payer: Medicare Other | Admitting: Internal Medicine

## 2020-02-08 ENCOUNTER — Encounter: Payer: Self-pay | Admitting: Internal Medicine

## 2020-02-08 VITALS — BP 110/70 | HR 56 | Temp 98.1°F | Wt 169.0 lb

## 2020-02-08 DIAGNOSIS — E119 Type 2 diabetes mellitus without complications: Secondary | ICD-10-CM | POA: Diagnosis not present

## 2020-02-08 DIAGNOSIS — E785 Hyperlipidemia, unspecified: Secondary | ICD-10-CM | POA: Diagnosis not present

## 2020-02-08 DIAGNOSIS — I10 Essential (primary) hypertension: Secondary | ICD-10-CM | POA: Diagnosis not present

## 2020-02-08 DIAGNOSIS — E538 Deficiency of other specified B group vitamins: Secondary | ICD-10-CM

## 2020-02-08 DIAGNOSIS — E118 Type 2 diabetes mellitus with unspecified complications: Secondary | ICD-10-CM | POA: Diagnosis not present

## 2020-02-08 DIAGNOSIS — E559 Vitamin D deficiency, unspecified: Secondary | ICD-10-CM

## 2020-02-08 LAB — POCT GLYCOSYLATED HEMOGLOBIN (HGB A1C): Hemoglobin A1C: 6.1 % — AB (ref 4.0–5.6)

## 2020-02-08 MED ORDER — CLONIDINE HCL 0.1 MG PO TABS: ORAL_TABLET | ORAL | 1 refills | Status: DC

## 2020-02-08 MED ORDER — FUROSEMIDE 40 MG PO TABS: 40.0000 mg | ORAL_TABLET | Freq: Two times a day (BID) | ORAL | 1 refills | Status: DC

## 2020-02-08 MED ORDER — SIMVASTATIN 40 MG PO TABS: ORAL_TABLET | ORAL | 0 refills | Status: DC

## 2020-02-08 MED ORDER — VITAMIN D (ERGOCALCIFEROL) 1.25 MG (50000 UNIT) PO CAPS: 50000.0000 [IU] | ORAL_CAPSULE | ORAL | 0 refills | Status: AC

## 2020-02-08 MED ORDER — METFORMIN HCL 500 MG PO TABS: ORAL_TABLET | ORAL | 8 refills | Status: DC

## 2020-02-08 MED ORDER — AMLODIPINE BESYLATE 10 MG PO TABS: 10.0000 mg | ORAL_TABLET | Freq: Every day | ORAL | 0 refills | Status: DC

## 2020-02-08 MED ORDER — CYANOCOBALAMIN 1000 MCG/ML IJ SOLN: INTRAMUSCULAR | 11 refills | Status: DC

## 2020-02-08 MED ORDER — LABETALOL HCL 200 MG PO TABS: 200.0000 mg | ORAL_TABLET | Freq: Two times a day (BID) | ORAL | 1 refills | Status: DC

## 2020-02-08 NOTE — Progress Notes (Signed)
Established Patient Office Visit     This visit occurred during the SARS-CoV-2 public health emergency.  Safety protocols were in place, including screening questions prior to the visit, additional usage of staff PPE, and extensive cleaning of exam room while observing appropriate contact time as indicated for disinfecting solutions.    CC/Reason for Visit: 30-month follow-up chronic conditions  HPI: Daniel Cochran is a 79 y.o. male who is coming in today for the above mentioned reasons. Past Medical History is significant for:  Type 2 diabetes, hyperlipidemia, hypertension all of which are very well controlled. He also has a history of left eye glaucoma who follows with his ophthalmologist routinely. He has mild erectile dysfunction for which he uses sildenafil as needed. During his physical in June he was diagnosed with vitamin D and vitamin B12 deficiency and is currently being replaced.  Yesterday he had an incident where a tree fell down in his yard and damaged his fence causing his cows to escape.  He had just been a longtime outside repairing the fence, cutting the tree and getting the cows back inside.  After that he felt a little lightheaded, improved after eating and he feels back to his usual health today.   Past Medical/Surgical History: Past Medical History:  Diagnosis Date  . Diabetes mellitus ([place])   . ED (erectile dysfunction)   . Glaucoma   . Hyperlipidemia   . Hypertension     No past surgical history on file.  Social History:  reports that he has never smoked. He has never used smokeless tobacco. He reports that he does not drink alcohol and does not use drugs.  Allergies: No Known Allergies  Family History:  Family History  Problem Relation Age of Onset  . Hyperlipidemia Other   . Hypertension Other      Current Outpatient Medications:  .  ACCU-CHEK FASTCLIX LANCETS MISC, Use once daily. Dx E11.9, Disp: 100 each, Rfl: 3 .  amLODipine (NORVASC)  10 MG tablet, Take 1 tablet (10 mg total) by mouth daily., Disp: 90 tablet, Rfl: 0 .  aspirin 81 MG tablet, Take 1 tablet (81 mg total) by mouth daily., Disp: 90 tablet, Rfl: 3 .  cloNIDine (CATAPRES) 0.1 MG tablet, Take 1 tablet by mouth twice daily, Disp: 180 tablet, Rfl: 1 .  cyanocobalamin (,VITAMIN B-12,) 1000 MCG/ML injection, Inject 1 ml into the deltoid once weekly for 4 weeks.  Then inject 1 ml once monthly thereafter., Disp: 1 mL, Rfl: 11 .  furosemide (LASIX) 40 MG tablet, Take 1 tablet (40 mg total) by mouth 2 (two) times daily., Disp: 180 tablet, Rfl: 1 .  glucose blood (ACCU-CHEK AVIVA PLUS) test strip, USE ONE STRIP TO CHECK GLUCOSE ONCE DAILY, Disp: 100 each, Rfl: 4 .  labetalol (NORMODYNE) 200 MG tablet, Take 1 tablet (200 mg total) by mouth 2 (two) times daily., Disp: 180 tablet, Rfl: 1 .  latanoprost (XALATAN) 0.005 % ophthalmic solution, , Disp: , Rfl:  .  metFORMIN (GLUCOPHAGE) 500 MG tablet, TAKE ONE TABLET BY MOUTH BEFORE BREAKFAST AND 1/2 TABLET PRIOR TO YOUR EVENING MEAL, Disp: 135 tablet, Rfl: 0 .  metFORMIN (GLUCOPHAGE) 500 MG tablet, TAKE 1 TABLET BY MOUTH BEFORE BREAKFAST AND 1/2 (ONE-HALF) BEFORE YOUR EVENING MEAL., Disp: 135 tablet, Rfl: 8 .  sildenafil (REVATIO) 20 MG tablet, Use as directed, Disp: 20 tablet, Rfl: 10 .  sildenafil (VIAGRA) 100 MG tablet, Take 1 tablet (100 mg total) by mouth at bedtime as needed  for erectile dysfunction (for erectile dysfunction)., Disp: 10 tablet, Rfl: 10 .  simvastatin (ZOCOR) 40 MG tablet, TAKE 1 TABLET BY MOUTH ONCE DAILY AT BEDTIME, Disp: 90 tablet, Rfl: 0 .  SYRINGE-NEEDLE, DISP, 3 ML (BD SAFETYGLIDE SYRINGE/NEEDLE) 25G X 1" 3 ML MISC, Use for B12 injections, Disp: 100 each, Rfl: 11 .  Vitamin D, Ergocalciferol, (DRISDOL) 1.25 MG (50000 UNIT) CAPS capsule, Take 1 capsule (50,000 Units total) by mouth every 7 (seven) days for 12 doses., Disp: 12 capsule, Rfl: 0  Review of Systems:  Constitutional: Denies fever, chills,  diaphoresis, appetite change and fatigue.  HEENT: Denies photophobia, eye pain, redness, hearing loss, ear pain, congestion, sore throat, rhinorrhea, sneezing, mouth sores, trouble swallowing, neck pain, neck stiffness and tinnitus.   Respiratory: Denies SOB, DOE, cough, chest tightness,  and wheezing.   Cardiovascular: Denies chest pain, palpitations and leg swelling.  Gastrointestinal: Denies nausea, vomiting, abdominal pain, diarrhea, constipation, blood in stool and abdominal distention.  Genitourinary: Denies dysuria, urgency, frequency, hematuria, flank pain and difficulty urinating.  Endocrine: Denies: hot or cold intolerance, sweats, changes in hair or nails, polyuria, polydipsia. Musculoskeletal: Denies myalgias, back pain, joint swelling, arthralgias and gait problem.  Skin: Denies pallor, rash and wound.  Neurological: Denies dizziness, seizures, syncope, weakness, light-headedness, numbness and headaches.  Hematological: Denies adenopathy. Easy bruising, personal or family bleeding history  Psychiatric/Behavioral: Denies suicidal ideation, mood changes, confusion, nervousness, sleep disturbance and agitation    Physical Exam: Vitals:   02/08/20 0928  BP: 110/70  Pulse: (!) 56  Temp: 98.1 F (36.7 C)  TempSrc: Temporal  SpO2: 96%  Weight: 169 lb (76.7 kg)    Body mass index is 23.24 kg/m.   Constitutional: NAD, calm, comfortable Eyes: PERRL, lids and conjunctivae normal, wears corrective lenses ENMT: Mucous membranes are moist.  Respiratory: clear to auscultation bilaterally, no wheezing, no crackles. Normal respiratory effort. No accessory muscle use.  Cardiovascular: Regular rate and rhythm, no murmurs / rubs / gallops. No extremity edema.  Neurologic: Grossly intact and nonfocal Psychiatric: Normal judgment and insight. Alert and oriented x 3. Normal mood.    Impression and Plan:  Essential hypertension  -Well-controlled on current regimen, refill  medications.  Controlled type 2 diabetes mellitus without complication, without long-term current use of insulin ([place])  -Well-controlled with an A1c in office today of 6.1, refill Metformin.  Hyperlipidemia, unspecified hyperlipidemia type -At goal with an LDL of 41 in July.  Continue statin.  Vitamin D deficiency  - Plan: Vitamin D, Ergocalciferol, (DRISDOL) 1.25 MG (50000 UNIT) CAPS capsule  Vitamin B12 deficiency  - Plan: cyanocobalamin (,VITAMIN B-12,) 1000 MCG/ML injection   Patient Instructions  -Nice seeing you today!!  -See you back in 3 months.     Lelon Frohlich, MD [facility] at [facility]

## 2020-02-08 NOTE — Patient Instructions (Signed)
-  Nice seeing you today!!  -See you back in 3 months. 

## 2020-02-20 DIAGNOSIS — L821 Other seborrheic keratosis: Secondary | ICD-10-CM | POA: Diagnosis not present

## 2020-02-20 DIAGNOSIS — L570 Actinic keratosis: Secondary | ICD-10-CM | POA: Diagnosis not present

## 2020-02-20 DIAGNOSIS — D225 Melanocytic nevi of trunk: Secondary | ICD-10-CM | POA: Diagnosis not present

## 2020-02-20 DIAGNOSIS — L905 Scar conditions and fibrosis of skin: Secondary | ICD-10-CM | POA: Diagnosis not present

## 2020-02-20 DIAGNOSIS — Z85828 Personal history of other malignant neoplasm of skin: Secondary | ICD-10-CM | POA: Diagnosis not present

## 2020-05-10 ENCOUNTER — Other Ambulatory Visit: Payer: Self-pay

## 2020-05-10 ENCOUNTER — Ambulatory Visit (INDEPENDENT_AMBULATORY_CARE_PROVIDER_SITE_OTHER): Payer: Medicare Other | Admitting: Internal Medicine

## 2020-05-10 ENCOUNTER — Encounter: Payer: Self-pay | Admitting: Internal Medicine

## 2020-05-10 VITALS — BP 130/80 | HR 48 | Temp 98.3°F | Ht 71.5 in | Wt 169.3 lb

## 2020-05-10 DIAGNOSIS — Z23 Encounter for immunization: Secondary | ICD-10-CM

## 2020-05-10 DIAGNOSIS — E119 Type 2 diabetes mellitus without complications: Secondary | ICD-10-CM | POA: Diagnosis not present

## 2020-05-10 DIAGNOSIS — E785 Hyperlipidemia, unspecified: Secondary | ICD-10-CM | POA: Diagnosis not present

## 2020-05-10 DIAGNOSIS — I10 Essential (primary) hypertension: Secondary | ICD-10-CM

## 2020-05-10 LAB — POCT GLYCOSYLATED HEMOGLOBIN (HGB A1C): Hemoglobin A1C: 5.9 % — AB (ref 4.0–5.6)

## 2020-05-10 NOTE — Patient Instructions (Signed)
-  Nice seeing you today!!  -Schedule follow up in 3-4 months. 

## 2020-05-10 NOTE — Addendum Note (Signed)
Addended by: Westley Hummer B on: 05/10/2020 04:31 PM   Modules accepted: Orders

## 2020-05-10 NOTE — Progress Notes (Signed)
Established Patient Office Visit     This visit occurred during the SARS-CoV-2 public health emergency.  Safety protocols were in place, including screening questions prior to the visit, additional usage of staff PPE, and extensive cleaning of exam room while observing appropriate contact time as indicated for disinfecting solutions.    CC/Reason for Visit: 29-month follow-up chronic medical conditions  HPI: Daniel Cochran is a 79 y.o. male who is coming in today for the above mentioned reasons. Past Medical History is significant for: Type 2 diabetes, hyperlipidemia, hypertension all of which are very well controlled. He also has a history of left eye glaucoma who follows with his ophthalmologist routinely. He has mild erectile dysfunction for which he uses sildenafil as needed.  He also has a history of vitamin D and vitamin B12 deficiency.  He had his flu shot recently, he has no acute complaints.     Past Medical/Surgical History: Past Medical History:  Diagnosis Date   Diabetes mellitus ([place])    ED (erectile dysfunction)    Glaucoma    Hyperlipidemia    Hypertension     No past surgical history on file.  Social History:  reports that he has never smoked. He has never used smokeless tobacco. He reports that he does not drink alcohol and does not use drugs.  Allergies: No Known Allergies  Family History:  Family History  Problem Relation Age of Onset   Hyperlipidemia Other    Hypertension Other      Current Outpatient Medications:    ACCU-CHEK FASTCLIX LANCETS MISC, Use once daily. Dx E11.9, Disp: 100 each, Rfl: 3   amLODipine (NORVASC) 10 MG tablet, Take 1 tablet (10 mg total) by mouth daily., Disp: 90 tablet, Rfl: 0   aspirin 81 MG tablet, Take 1 tablet (81 mg total) by mouth daily., Disp: 90 tablet, Rfl: 3   cloNIDine (CATAPRES) 0.1 MG tablet, Take 1 tablet by mouth twice daily, Disp: 180 tablet, Rfl: 1   cyanocobalamin (,VITAMIN B-12,) 1000  MCG/ML injection, Inject 1 ml into the deltoid once weekly for 4 weeks.  Then inject 1 ml once monthly thereafter., Disp: 1 mL, Rfl: 11   furosemide (LASIX) 40 MG tablet, Take 1 tablet (40 mg total) by mouth 2 (two) times daily., Disp: 180 tablet, Rfl: 1   glucose blood (ACCU-CHEK AVIVA PLUS) test strip, USE ONE STRIP TO CHECK GLUCOSE ONCE DAILY, Disp: 100 each, Rfl: 4   labetalol (NORMODYNE) 200 MG tablet, Take 1 tablet (200 mg total) by mouth 2 (two) times daily., Disp: 180 tablet, Rfl: 1   latanoprost (XALATAN) 0.005 % ophthalmic solution, , Disp: , Rfl:    metFORMIN (GLUCOPHAGE) 500 MG tablet, TAKE ONE TABLET BY MOUTH BEFORE BREAKFAST AND 1/2 TABLET PRIOR TO YOUR EVENING MEAL, Disp: 135 tablet, Rfl: 0   metFORMIN (GLUCOPHAGE) 500 MG tablet, TAKE 1 TABLET BY MOUTH BEFORE BREAKFAST AND 1/2 (ONE-HALF) BEFORE YOUR EVENING MEAL., Disp: 135 tablet, Rfl: 8   sildenafil (REVATIO) 20 MG tablet, Use as directed, Disp: 20 tablet, Rfl: 10   sildenafil (VIAGRA) 100 MG tablet, Take 1 tablet (100 mg total) by mouth at bedtime as needed for erectile dysfunction (for erectile dysfunction)., Disp: 10 tablet, Rfl: 10   simvastatin (ZOCOR) 40 MG tablet, TAKE 1 TABLET BY MOUTH ONCE DAILY AT BEDTIME, Disp: 90 tablet, Rfl: 0   SYRINGE-NEEDLE, DISP, 3 ML (BD SAFETYGLIDE SYRINGE/NEEDLE) 25G X 1" 3 ML MISC, Use for B12 injections, Disp: 100 each, Rfl: 11  Review of Systems:  Constitutional: Denies fever, chills, diaphoresis, appetite change and fatigue.  HEENT: Denies photophobia, eye pain, redness, hearing loss, ear pain, congestion, sore throat, rhinorrhea, sneezing, mouth sores, trouble swallowing, neck pain, neck stiffness and tinnitus.   Respiratory: Denies SOB, DOE, cough, chest tightness,  and wheezing.   Cardiovascular: Denies chest pain, palpitations and leg swelling.  Gastrointestinal: Denies nausea, vomiting, abdominal pain, diarrhea, constipation, blood in stool and abdominal distention.    Genitourinary: Denies dysuria, urgency, frequency, hematuria, flank pain and difficulty urinating.  Endocrine: Denies: hot or cold intolerance, sweats, changes in hair or nails, polyuria, polydipsia. Musculoskeletal: Denies myalgias, back pain, joint swelling, arthralgias and gait problem.  Skin: Denies pallor, rash and wound.  Neurological: Denies dizziness, seizures, syncope, weakness, light-headedness, numbness and headaches.  Hematological: Denies adenopathy. Easy bruising, personal or family bleeding history  Psychiatric/Behavioral: Denies suicidal ideation, mood changes, confusion, nervousness, sleep disturbance and agitation    Physical Exam: Vitals:   05/10/20 0955  BP: 130/80  Pulse: (!) 48  Temp: 98.3 F (36.8 C)  TempSrc: Oral  SpO2: 95%  Weight: 169 lb 4.8 oz (76.8 kg)  Height: 5' 11.5" (1.816 m)    Body mass index is 23.28 kg/m.   Constitutional: NAD, calm, comfortable Eyes: PERRL, lids and conjunctivae normal, wears corrective lenses ENMT: Mucous membranes are moist.  Respiratory: clear to auscultation bilaterally, no wheezing, no crackles. Normal respiratory effort. No accessory muscle use.  Cardiovascular: Regular rate and rhythm, no murmurs / rubs / gallops. No extremity edema.  Neurologic: Grossly intact and nonfocal Psychiatric: Normal judgment and insight. Alert and oriented x 3. Normal mood.    Impression and Plan:  Controlled type 2 diabetes mellitus without complication, without long-term current use of insulin ([place]) -Well-controlled with an A1c of 5.9 today.  Hyperlipidemia, unspecified hyperlipidemia type -Last LDL was 54 in June 2020, continue simvastatin.  Essential hypertension -Controlled, continue amlodipine, clonidine, Lasix, labetalol.    Patient Instructions  -Nice seeing you today!!  -Schedule follow up in 3-4 months.     Lelon Frohlich, MD [facility] at [facility]

## 2020-05-27 DIAGNOSIS — H401111 Primary open-angle glaucoma, right eye, mild stage: Secondary | ICD-10-CM | POA: Diagnosis not present

## 2020-05-27 DIAGNOSIS — H04123 Dry eye syndrome of bilateral lacrimal glands: Secondary | ICD-10-CM | POA: Diagnosis not present

## 2020-05-27 DIAGNOSIS — H401123 Primary open-angle glaucoma, left eye, severe stage: Secondary | ICD-10-CM | POA: Diagnosis not present

## 2020-05-27 DIAGNOSIS — E119 Type 2 diabetes mellitus without complications: Secondary | ICD-10-CM | POA: Diagnosis not present

## 2020-05-27 DIAGNOSIS — H2511 Age-related nuclear cataract, right eye: Secondary | ICD-10-CM | POA: Diagnosis not present

## 2020-05-27 LAB — HM DIABETES EYE EXAM

## 2020-05-30 ENCOUNTER — Encounter: Payer: Self-pay | Admitting: Internal Medicine

## 2020-06-11 ENCOUNTER — Other Ambulatory Visit: Payer: Self-pay | Admitting: Internal Medicine

## 2020-06-11 DIAGNOSIS — I10 Essential (primary) hypertension: Secondary | ICD-10-CM

## 2020-08-22 DIAGNOSIS — D1801 Hemangioma of skin and subcutaneous tissue: Secondary | ICD-10-CM | POA: Diagnosis not present

## 2020-08-22 DIAGNOSIS — L821 Other seborrheic keratosis: Secondary | ICD-10-CM | POA: Diagnosis not present

## 2020-08-22 DIAGNOSIS — L814 Other melanin hyperpigmentation: Secondary | ICD-10-CM | POA: Diagnosis not present

## 2020-08-22 DIAGNOSIS — D225 Melanocytic nevi of trunk: Secondary | ICD-10-CM | POA: Diagnosis not present

## 2020-08-22 DIAGNOSIS — Z85828 Personal history of other malignant neoplasm of skin: Secondary | ICD-10-CM | POA: Diagnosis not present

## 2020-08-22 DIAGNOSIS — L570 Actinic keratosis: Secondary | ICD-10-CM | POA: Diagnosis not present

## 2020-08-22 DIAGNOSIS — D229 Melanocytic nevi, unspecified: Secondary | ICD-10-CM | POA: Diagnosis not present

## 2020-08-22 DIAGNOSIS — L905 Scar conditions and fibrosis of skin: Secondary | ICD-10-CM | POA: Diagnosis not present

## 2020-09-09 ENCOUNTER — Other Ambulatory Visit: Payer: Self-pay

## 2020-09-09 ENCOUNTER — Other Ambulatory Visit: Payer: Self-pay | Admitting: Internal Medicine

## 2020-09-09 DIAGNOSIS — I10 Essential (primary) hypertension: Secondary | ICD-10-CM

## 2020-09-10 ENCOUNTER — Ambulatory Visit: Payer: Medicare Other | Admitting: Internal Medicine

## 2020-09-17 ENCOUNTER — Other Ambulatory Visit: Payer: Self-pay

## 2020-09-17 ENCOUNTER — Encounter: Payer: Self-pay | Admitting: Internal Medicine

## 2020-09-17 ENCOUNTER — Ambulatory Visit (INDEPENDENT_AMBULATORY_CARE_PROVIDER_SITE_OTHER): Payer: Medicare Other | Admitting: Internal Medicine

## 2020-09-17 VITALS — BP 130/80 | HR 51 | Temp 97.9°F | Wt 169.3 lb

## 2020-09-17 DIAGNOSIS — I10 Essential (primary) hypertension: Secondary | ICD-10-CM | POA: Diagnosis not present

## 2020-09-17 DIAGNOSIS — E785 Hyperlipidemia, unspecified: Secondary | ICD-10-CM

## 2020-09-17 DIAGNOSIS — E538 Deficiency of other specified B group vitamins: Secondary | ICD-10-CM

## 2020-09-17 DIAGNOSIS — E119 Type 2 diabetes mellitus without complications: Secondary | ICD-10-CM

## 2020-09-17 DIAGNOSIS — H409 Unspecified glaucoma: Secondary | ICD-10-CM | POA: Diagnosis not present

## 2020-09-17 LAB — POCT GLYCOSYLATED HEMOGLOBIN (HGB A1C): Hemoglobin A1C: 6 % — AB (ref 4.0–5.6)

## 2020-09-17 MED ORDER — SIMVASTATIN 40 MG PO TABS: ORAL_TABLET | ORAL | 0 refills | Status: DC

## 2020-09-17 MED ORDER — CLONIDINE HCL 0.1 MG PO TABS: 0.1000 mg | ORAL_TABLET | Freq: Two times a day (BID) | ORAL | 1 refills | Status: DC

## 2020-09-17 MED ORDER — ACCU-CHEK FASTCLIX LANCETS MISC: 3 refills | Status: DC

## 2020-09-17 MED ORDER — METFORMIN HCL 500 MG PO TABS
ORAL_TABLET | ORAL | 0 refills | Status: DC
Start: 2020-09-17 — End: 2021-06-27

## 2020-09-17 MED ORDER — FUROSEMIDE 40 MG PO TABS: 40.0000 mg | ORAL_TABLET | Freq: Two times a day (BID) | ORAL | 1 refills | Status: DC

## 2020-09-17 MED ORDER — CYANOCOBALAMIN 1000 MCG/ML IJ SOLN
1000.0000 ug | Freq: Once | INTRAMUSCULAR | Status: AC
  Administered 2020-09-17: 1000 ug via INTRAMUSCULAR

## 2020-09-17 MED ORDER — ACCU-CHEK AVIVA PLUS VI STRP
ORAL_STRIP | 4 refills | Status: DC
Start: 2020-09-17 — End: 2023-10-26

## 2020-09-17 MED ORDER — LABETALOL HCL 200 MG PO TABS
200.0000 mg | ORAL_TABLET | Freq: Two times a day (BID) | ORAL | 1 refills | Status: DC
Start: 2020-09-17 — End: 2021-03-25

## 2020-09-17 MED ORDER — AMLODIPINE BESYLATE 10 MG PO TABS: 10.0000 mg | ORAL_TABLET | Freq: Every day | ORAL | 1 refills | Status: DC

## 2020-09-17 NOTE — Patient Instructions (Signed)
-  Nice seeing you today!!  -Lab work today; will notify you once results are available.  -B12 injection today.  -Schedule follow up in 4 months for your physical. Please come in fasting that day.

## 2020-09-17 NOTE — Addendum Note (Signed)
Addended by: Westley Hummer B on: 09/17/2020 10:05 AM   Modules accepted: Orders

## 2020-09-17 NOTE — Progress Notes (Signed)
Per orders of Dr. Hernandez, injection of B12 given by Rachel Vereen. Patient tolerated injection well.  

## 2020-09-17 NOTE — Progress Notes (Signed)
Established Patient Office Visit     This visit occurred during the SARS-CoV-2 public health emergency.  Safety protocols were in place, including screening questions prior to the visit, additional usage of staff PPE, and extensive cleaning of exam room while observing appropriate contact time as indicated for disinfecting solutions.    CC/Reason for Visit: Follow-up chronic medical conditions  HPI: HILLIS MCPHATTER is a 80 y.o. male who is coming in today for the above mentioned reasons. Past Medical History is significant for: Type 2 diabetes, hyperlipidemia, hypertension all of which are very well controlled. He also has a history of left eye glaucoma who follows with his ophthalmologist routinely. He has mild erectile dysfunction for which he uses sildenafil as needed.  He also has a history of vitamin D and vitamin B12 deficiency.  He has been doing well and has no acute complaints today.  He has not been doing his B12 injections as he does not feel comfortable doing them.   Past Medical/Surgical History: Past Medical History:  Diagnosis Date  . Diabetes mellitus ([place])   . ED (erectile dysfunction)   . Glaucoma   . Hyperlipidemia   . Hypertension     No past surgical history on file.  Social History:  reports that he has never smoked. He has never used smokeless tobacco. He reports that he does not drink alcohol and does not use drugs.  Allergies: No Known Allergies  Family History:  Family History  Problem Relation Age of Onset  . Hyperlipidemia Other   . Hypertension Other      Current Outpatient Medications:  .  aspirin 81 MG tablet, Take 1 tablet (81 mg total) by mouth daily., Disp: 90 tablet, Rfl: 3 .  cyanocobalamin (,VITAMIN B-12,) 1000 MCG/ML injection, Inject 1 ml into the deltoid once weekly for 4 weeks.  Then inject 1 ml once monthly thereafter., Disp: 1 mL, Rfl: 11 .  latanoprost (XALATAN) 0.005 % ophthalmic solution, , Disp: , Rfl:  .  metFORMIN  (GLUCOPHAGE) 500 MG tablet, TAKE 1 TABLET BY MOUTH BEFORE BREAKFAST AND 1/2 (ONE-HALF) BEFORE YOUR EVENING MEAL., Disp: 135 tablet, Rfl: 8 .  sildenafil (REVATIO) 20 MG tablet, Use as directed, Disp: 20 tablet, Rfl: 10 .  sildenafil (VIAGRA) 100 MG tablet, Take 1 tablet (100 mg total) by mouth at bedtime as needed for erectile dysfunction (for erectile dysfunction)., Disp: 10 tablet, Rfl: 10 .  SYRINGE-NEEDLE, DISP, 3 ML (BD SAFETYGLIDE SYRINGE/NEEDLE) 25G X 1" 3 ML MISC, Use for B12 injections, Disp: 100 each, Rfl: 11 .  Accu-Chek FastClix Lancets MISC, Use once daily. Dx E11.9, Disp: 100 each, Rfl: 3 .  amLODipine (NORVASC) 10 MG tablet, Take 1 tablet (10 mg total) by mouth daily., Disp: 90 tablet, Rfl: 1 .  cloNIDine (CATAPRES) 0.1 MG tablet, Take 1 tablet (0.1 mg total) by mouth 2 (two) times daily., Disp: 180 tablet, Rfl: 1 .  furosemide (LASIX) 40 MG tablet, Take 1 tablet (40 mg total) by mouth 2 (two) times daily., Disp: 180 tablet, Rfl: 1 .  glucose blood (ACCU-CHEK AVIVA PLUS) test strip, USE ONE STRIP TO CHECK GLUCOSE ONCE DAILY, Disp: 100 each, Rfl: 4 .  labetalol (NORMODYNE) 200 MG tablet, Take 1 tablet (200 mg total) by mouth 2 (two) times daily., Disp: 180 tablet, Rfl: 1 .  metFORMIN (GLUCOPHAGE) 500 MG tablet, TAKE ONE TABLET BY MOUTH BEFORE BREAKFAST AND 1/2 TABLET PRIOR TO YOUR EVENING MEAL, Disp: 135 tablet, Rfl: 0 .  simvastatin (ZOCOR) 40 MG tablet, TAKE 1 TABLET BY MOUTH ONCE DAILY AT BEDTIME, Disp: 90 tablet, Rfl: 0  Review of Systems:  Constitutional: Denies fever, chills, diaphoresis, appetite change and fatigue.  HEENT: Denies photophobia, eye pain, redness, hearing loss, ear pain, congestion, sore throat, rhinorrhea, sneezing, mouth sores, trouble swallowing, neck pain, neck stiffness and tinnitus.   Respiratory: Denies SOB, DOE, cough, chest tightness,  and wheezing.   Cardiovascular: Denies chest pain, palpitations and leg swelling.  Gastrointestinal: Denies nausea,  vomiting, abdominal pain, diarrhea, constipation, blood in stool and abdominal distention.  Genitourinary: Denies dysuria, urgency, frequency, hematuria, flank pain and difficulty urinating.  Endocrine: Denies: hot or cold intolerance, sweats, changes in hair or nails, polyuria, polydipsia. Musculoskeletal: Denies myalgias, back pain, joint swelling, arthralgias and gait problem.  Skin: Denies pallor, rash and wound.  Neurological: Denies dizziness, seizures, syncope, weakness, light-headedness, numbness and headaches.  Hematological: Denies adenopathy. Easy bruising, personal or family bleeding history  Psychiatric/Behavioral: Denies suicidal ideation, mood changes, confusion, nervousness, sleep disturbance and agitation    Physical Exam: Vitals:   09/17/20 0925  BP: 130/80  Pulse: (!) 51  Temp: 97.9 F (36.6 C)  TempSrc: Oral  SpO2: 96%  Weight: 169 lb 4.8 oz (76.8 kg)    Body mass index is 23.28 kg/m.   Constitutional: NAD, calm, comfortable Eyes: PERRL, lids and conjunctivae normal, wears corrective lenses ENMT: Mucous membranes are moist.  Respiratory: clear to auscultation bilaterally, no wheezing, no crackles. Normal respiratory effort. No accessory muscle use.  Cardiovascular: Regular rate and rhythm, no murmurs / rubs / gallops. No extremity edema.  Neurologic: Grossly intact and nonfocal. Psychiatric: Normal judgment and insight. Alert and oriented x 3. Normal mood.    Impression and Plan:  Controlled type 2 diabetes mellitus without complication, without long-term current use of insulin (HCC)  -A1c in office today demonstrates excellent control at 6.0. -Accu-Chek FastClix Lancets MISC, glucose blood (ACCU-CHEK AVIVA PLUS) test strip, metFORMIN (GLUCOPHAGE) 500 MG tablet  Essential hypertension  -Well-controlled on current medication. - Plan: amLODipine (NORVASC) 10 MG tablet, cloNIDine (CATAPRES) 0.1 MG tablet, furosemide (LASIX) 40 MG tablet, labetalol  (NORMODYNE) 200 MG tablet  Hyperlipidemia, unspecified hyperlipidemia type  - Plan: simvastatin (ZOCOR) 40 MG tablet -Check lipids fasting when he returns for physical.  Vitamin B12 deficiency -B12 injection today, check levels when he returns for physical.  Glaucoma of both eyes, unspecified glaucoma type -Follows with ophthalmology.   Patient Instructions  -Nice seeing you today!!  -Lab work today; will notify you once results are available.  -B12 injection today.  -Schedule follow up in 4 months for your physical. Please come in fasting that day.     Lelon Frohlich, MD [facility]

## 2020-11-26 DIAGNOSIS — H2511 Age-related nuclear cataract, right eye: Secondary | ICD-10-CM | POA: Diagnosis not present

## 2020-11-26 DIAGNOSIS — H401111 Primary open-angle glaucoma, right eye, mild stage: Secondary | ICD-10-CM | POA: Diagnosis not present

## 2020-11-26 DIAGNOSIS — Z961 Presence of intraocular lens: Secondary | ICD-10-CM | POA: Diagnosis not present

## 2020-11-26 DIAGNOSIS — H04123 Dry eye syndrome of bilateral lacrimal glands: Secondary | ICD-10-CM | POA: Diagnosis not present

## 2020-11-26 DIAGNOSIS — H401123 Primary open-angle glaucoma, left eye, severe stage: Secondary | ICD-10-CM | POA: Diagnosis not present

## 2021-01-17 ENCOUNTER — Encounter: Payer: Medicare Other | Admitting: Internal Medicine

## 2021-01-30 ENCOUNTER — Other Ambulatory Visit: Payer: Self-pay | Admitting: Internal Medicine

## 2021-01-30 ENCOUNTER — Other Ambulatory Visit: Payer: Self-pay

## 2021-01-30 ENCOUNTER — Encounter: Payer: Self-pay | Admitting: Internal Medicine

## 2021-01-30 ENCOUNTER — Ambulatory Visit (INDEPENDENT_AMBULATORY_CARE_PROVIDER_SITE_OTHER): Payer: Medicare Other | Admitting: Internal Medicine

## 2021-01-30 VITALS — BP 130/70 | HR 73 | Temp 98.1°F | Ht 72.0 in | Wt 163.9 lb

## 2021-01-30 DIAGNOSIS — E559 Vitamin D deficiency, unspecified: Secondary | ICD-10-CM | POA: Diagnosis not present

## 2021-01-30 DIAGNOSIS — E785 Hyperlipidemia, unspecified: Secondary | ICD-10-CM | POA: Diagnosis not present

## 2021-01-30 DIAGNOSIS — H409 Unspecified glaucoma: Secondary | ICD-10-CM | POA: Diagnosis not present

## 2021-01-30 DIAGNOSIS — E538 Deficiency of other specified B group vitamins: Secondary | ICD-10-CM

## 2021-01-30 DIAGNOSIS — I10 Essential (primary) hypertension: Secondary | ICD-10-CM

## 2021-01-30 DIAGNOSIS — E118 Type 2 diabetes mellitus with unspecified complications: Secondary | ICD-10-CM | POA: Diagnosis not present

## 2021-01-30 DIAGNOSIS — Z0000 Encounter for general adult medical examination without abnormal findings: Secondary | ICD-10-CM | POA: Diagnosis not present

## 2021-01-30 LAB — COMPREHENSIVE METABOLIC PANEL
ALT: 9 U/L (ref 0–53)
AST: 20 U/L (ref 0–37)
Albumin: 4.6 g/dL (ref 3.5–5.2)
Alkaline Phosphatase: 72 U/L (ref 39–117)
BUN: 21 mg/dL (ref 6–23)
CO2: 30 mEq/L (ref 19–32)
Calcium: 9.3 mg/dL (ref 8.4–10.5)
Chloride: 101 mEq/L (ref 96–112)
Creatinine, Ser: 1.27 mg/dL (ref 0.40–1.50)
GFR: 53.41 mL/min — ABNORMAL LOW (ref >60.00)
Glucose, Bld: 130 mg/dL — ABNORMAL HIGH (ref 70–99)
Potassium: 3.5 mEq/L (ref 3.5–5.1)
Sodium: 140 mEq/L (ref 135–145)
Total Bilirubin: 0.8 mg/dL (ref 0.2–1.2)
Total Protein: 6.8 g/dL (ref 6.0–8.3)

## 2021-01-30 LAB — CBC WITH DIFFERENTIAL/PLATELET
Basophils Absolute: 0 10*3/uL (ref 0.0–0.1)
Basophils Relative: 0.2 % (ref 0.0–3.0)
Eosinophils Absolute: 0.1 10*3/uL (ref 0.0–0.7)
Eosinophils Relative: 0.9 % (ref 0.0–5.0)
HCT: 37.8 % — ABNORMAL LOW (ref 39.0–52.0)
Hemoglobin: 13.3 g/dL (ref 13.0–17.0)
Lymphocytes Relative: 20.3 % (ref 12.0–46.0)
Lymphs Abs: 2.1 10*3/uL (ref 0.7–4.0)
MCHC: 35.2 g/dL (ref 30.0–36.0)
MCV: 90.8 fl (ref 78.0–100.0)
Monocytes Absolute: 0.7 10*3/uL (ref 0.1–1.0)
Monocytes Relative: 6.5 % (ref 3.0–12.0)
Neutro Abs: 7.3 10*3/uL (ref 1.4–7.7)
Neutrophils Relative %: 72.1 % (ref 43.0–77.0)
Platelets: 233 10*3/uL (ref 150.0–400.0)
RBC: 4.16 Mil/uL — ABNORMAL LOW (ref 4.22–5.81)
RDW: 12.7 % (ref 11.5–15.5)
WBC: 10.1 10*3/uL (ref 4.0–10.5)

## 2021-01-30 LAB — LIPID PANEL
Cholesterol: 117 mg/dL (ref 0–200)
HDL: 45.5 mg/dL (ref >39.00)
LDL Cholesterol: 55 mg/dL (ref 0–99)
NonHDL: 71.33
Total CHOL/HDL Ratio: 3
Triglycerides: 81 mg/dL (ref 0.0–149.0)
VLDL: 16.2 mg/dL (ref 0.0–40.0)

## 2021-01-30 LAB — TSH: TSH: 1.89 u[IU]/mL (ref 0.35–5.50)

## 2021-01-30 LAB — HEMOGLOBIN A1C: Hgb A1c MFr Bld: 6.4 % (ref 4.6–6.5)

## 2021-01-30 LAB — VITAMIN B12: Vitamin B-12: >1550 pg/mL — ABNORMAL HIGH (ref 211–911)

## 2021-01-30 LAB — VITAMIN D 25 HYDROXY (VIT D DEFICIENCY, FRACTURES): VITD: 26.56 ng/mL — ABNORMAL LOW (ref 30.00–100.00)

## 2021-01-30 MED ORDER — CYANOCOBALAMIN 1000 MCG/ML IJ SOLN
1000.0000 ug | Freq: Once | INTRAMUSCULAR | Status: AC
  Administered 2021-01-30: 1000 ug via INTRAMUSCULAR

## 2021-01-30 MED ORDER — VITAMIN D (ERGOCALCIFEROL) 1.25 MG (50000 UNIT) PO CAPS: 50000.0000 [IU] | ORAL_CAPSULE | ORAL | 0 refills | Status: AC

## 2021-01-30 NOTE — Patient Instructions (Signed)
-  Nice seeing you today!!  -Lab work today; will notify you once results are available.  -B12 injection today.  -Remember to ger your 4th COVID and your tetanus vaccine at the pharmacy.  -Schedule follow up in 3 months.

## 2021-01-30 NOTE — Progress Notes (Signed)
Established Patient Office Visit     This visit occurred during the SARS-CoV-2 public health emergency.  Safety protocols were in place, including screening questions prior to the visit, additional usage of staff PPE, and extensive cleaning of exam room while observing appropriate contact time as indicated for disinfecting solutions.    CC/Reason for Visit: Annual preventive exam and subsequent Medicare wellness visit  HPI: Daniel Cochran is a 80 y.o. male who is coming in today for the above mentioned reasons. Past Medical History is significant for: Well-controlled type 2 diabetes, hyperlipidemia, hypertension, glaucoma followed routinely by ophthalmology, vitamin D and vitamin B12 deficiency.  He is overdue for his fourth COVID and his Tdap vaccines.  He has decided to no longer pursue colon cancer screening due to age.  He has routine eye and dental care.  He has a history of hearing loss but feels it is at baseline.  He does not wear hearing aids.  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  He is a little bit concerned of weight loss.  He has lost 4 pounds since March.  We have done a dietary review today.  He is not getting in hardly any protein.  He mainly relies on canned vegetables, frozen meals and takeout.   Past Medical/Surgical History: Past Medical History:  Diagnosis Date   Diabetes mellitus ([place])    ED (erectile dysfunction)    Glaucoma    Hyperlipidemia    Hypertension     No past surgical history on file.  Social History:  reports that he has never smoked. He has never used smokeless tobacco. He reports that he does not drink alcohol and does not use drugs.  Allergies: No Known Allergies  Family History:  Family History  Problem Relation Age of Onset   Hyperlipidemia Other    Hypertension Other      Current Outpatient Medications:    Accu-Chek FastClix  Lancets MISC, Use once daily. Dx E11.9, Disp: 100 each, Rfl: 3   amLODipine (NORVASC) 10 MG tablet, Take 1 tablet (10 mg total) by mouth daily., Disp: 90 tablet, Rfl: 1   aspirin 81 MG tablet, Take 1 tablet (81 mg total) by mouth daily., Disp: 90 tablet, Rfl: 3   cloNIDine (CATAPRES) 0.1 MG tablet, Take 1 tablet (0.1 mg total) by mouth 2 (two) times daily., Disp: 180 tablet, Rfl: 1   cyanocobalamin (,VITAMIN B-12,) 1000 MCG/ML injection, Inject 1 ml into the deltoid once weekly for 4 weeks.  Then inject 1 ml once monthly thereafter., Disp: 1 mL, Rfl: 11   furosemide (LASIX) 40 MG tablet, Take 1 tablet (40 mg total) by mouth 2 (two) times daily., Disp: 180 tablet, Rfl: 1   glucose blood (ACCU-CHEK AVIVA PLUS) test strip, USE ONE STRIP TO CHECK GLUCOSE ONCE DAILY, Disp: 100 each, Rfl: 4   labetalol (NORMODYNE) 200 MG tablet, Take 1 tablet (200 mg total) by mouth 2 (two) times daily., Disp: 180 tablet, Rfl: 1   latanoprost (XALATAN) 0.005 % ophthalmic solution, , Disp: , Rfl:    metFORMIN (GLUCOPHAGE) 500 MG tablet, TAKE 1 TABLET BY MOUTH BEFORE BREAKFAST AND 1/2 (ONE-HALF) BEFORE YOUR EVENING MEAL., Disp: 135 tablet, Rfl: 8   metFORMIN (GLUCOPHAGE) 500 MG tablet, TAKE ONE TABLET BY MOUTH BEFORE BREAKFAST AND 1/2 TABLET PRIOR TO YOUR EVENING MEAL, Disp: 135 tablet, Rfl: 0   sildenafil (  REVATIO) 20 MG tablet, Use as directed, Disp: 20 tablet, Rfl: 10   sildenafil (VIAGRA) 100 MG tablet, Take 1 tablet (100 mg total) by mouth at bedtime as needed for erectile dysfunction (for erectile dysfunction)., Disp: 10 tablet, Rfl: 10   simvastatin (ZOCOR) 40 MG tablet, TAKE 1 TABLET BY MOUTH ONCE DAILY AT BEDTIME, Disp: 90 tablet, Rfl: 0   SYRINGE-NEEDLE, DISP, 3 ML (BD SAFETYGLIDE SYRINGE/NEEDLE) 25G X 1" 3 ML MISC, Use for B12 injections, Disp: 100 each, Rfl: 11  Review of Systems:  Constitutional: Denies fever, chills, diaphoresis, appetite change and fatigue.  HEENT: Denies photophobia, eye pain, redness,  hearing loss, ear pain, congestion, sore throat, rhinorrhea, sneezing, mouth sores, trouble swallowing, neck pain, neck stiffness and tinnitus.   Respiratory: Denies SOB, DOE, cough, chest tightness,  and wheezing.   Cardiovascular: Denies chest pain, palpitations and leg swelling.  Gastrointestinal: Denies nausea, vomiting, abdominal pain, diarrhea, constipation, blood in stool and abdominal distention.  Genitourinary: Denies dysuria, urgency, frequency, hematuria, flank pain and difficulty urinating.  Endocrine: Denies: hot or cold intolerance, sweats, changes in hair or nails, polyuria, polydipsia. Musculoskeletal: Denies myalgias, back pain, joint swelling, arthralgias and gait problem.  Skin: Denies pallor, rash and wound.  Neurological: Denies dizziness, seizures, syncope, weakness, light-headedness, numbness and headaches.  Hematological: Denies adenopathy. Easy bruising, personal or family bleeding history  Psychiatric/Behavioral: Denies suicidal ideation, mood changes, confusion, nervousness, sleep disturbance and agitation    Physical Exam: Vitals:   01/30/21 0755  BP: 130/70  Pulse: 73  Temp: 98.1 F (36.7 C)  TempSrc: Oral  SpO2: 97%  Weight: 163 lb 14.4 oz (74.3 kg)  Height: 6' (1.829 m)    Body mass index is 22.23 kg/m.   Constitutional: NAD, calm, comfortable Eyes: PERRL, lids and conjunctivae normal, wears corrective lenses ENMT: Mucous membranes are moist. Posterior pharynx clear of any exudate or lesions. Normal dentition. Tympanic membrane is pearly white, no erythema or bulging. Neck: normal, supple, no masses, no thyromegaly Respiratory: clear to auscultation bilaterally, no wheezing, no crackles. Normal respiratory effort. No accessory muscle use.  Cardiovascular: Regular rate and rhythm, no murmurs / rubs / gallops. No extremity edema. 2+ pedal pulses. No carotid bruits.  Abdomen: no tenderness, no masses palpated. No hepatosplenomegaly. Bowel sounds  positive.  Musculoskeletal: no clubbing / cyanosis. No joint deformity upper and lower extremities. Good ROM, no contractures. Normal muscle tone.  Skin: no rashes, lesions, ulcers. No induration Neurologic: CN 2-12 grossly intact. Sensation intact, DTR normal. Strength 5/5 in all 4.  Psychiatric: Normal judgment and insight. Alert and oriented x 3. Normal mood.    Subsequent Medicare wellness visit   1. Risk factors, based on past  M,S,F -cardiovascular disease risk factors include age, gender, history of hypertension, hyperlipidemia and diabetes   2.  Physical activities: Very active around his farm   3.  Depression/mood: Stable, not depressed   4.  Hearing: At baseline, he is not interested in formal hearing testing or hearing aids.   5.  ADL's: Independent in all ADLs   6.  Fall risk: Low fall risk   7.  Home safety: No problems identified   8.  Height weight, and visual acuity: height and weight as above, vision:  Vision Screening   Right eye Left eye Both eyes  Without correction     With correction 20/20 20/20 20/20      9.  Counseling: We have talked extensively about healthy lifestyle, especially eating habits   10. Lab orders  based on risk factors: Laboratory update will be reviewed   11. Referral : None today   12. Care plan: Follow-up with me in 3 months   13. Cognitive assessment: No cognitive impairment   14. Screening: Patient provided with a written and personalized 5-10 year screening schedule in the AVS. yes   15. Provider List Update: PCP only  16. Advance Directives: Full code   17. Opioids: Patient is not on any opioid prescriptions and has no risk factors for a substance use disorder.   [facility] Office Visit from 01/30/2021 in [facility] at [facility]  PHQ-9 Total Score 3       Fall Risk  01/30/2021 02/08/2020 12/20/2018 08/19/2018 12/16/2017  Falls in the past year? 0 1 1 1  No  Number falls in past yr: 0 0 0 1 -  Injury with  Fall? 0 0 1 1 -      Impression and Plan:  Encounter for preventive health examination  -He has routine eye and dental care. -He is due for his second COVID booster and his Tdap which she agrees to obtain at pharmacy. -Screening labs today. -Healthy lifestyle has been discussed in detail.  Due to his concerns with weight loss we have discussed increasing protein and dairy products.  He will try fresh fruits and vegetables instead of canned, he will try to take an Ensure a day and he will bring a dietary log to his next visit. -He has elected to defer further colon cancer screening due to age, I agree.  Controlled type 2 diabetes mellitus with complication, without long-term current use of insulin ([place]), Chronic -His last A1c was 6.0 in March, recheck A1c today.  He continues on metformin only.  Hyperlipidemia, unspecified hyperlipidemia type  - Plan: Lipid panel  Essential hypertension  - Plan: CBC with Differential/Platelet, Comprehensive metabolic panel -Blood pressure is well controlled.  Vitamin B12 deficiency  - Plan: Vitamin B12 -He will receive a B12 injection today.  Vitamin D deficiency  - Plan: VITAMIN D 25 Hydroxy (Vit-D Deficiency, Fractures)  Glaucoma of both eyes, unspecified glaucoma type -Follows every 6 months with ophthalmology.   Patient Instructions  -Nice seeing you today!!  -Lab work today; will notify you once results are available.  -B12 injection today.  -Remember to ger your 4th COVID and your tetanus vaccine at the pharmacy.  -Schedule follow up in 3 months.      Lelon Frohlich, MD [facility] at [facility]

## 2021-01-30 NOTE — Addendum Note (Signed)
Addended by: Westley Hummer B on: 01/30/2021 12:01 PM   Modules accepted: Orders

## 2021-01-30 NOTE — Addendum Note (Signed)
Addended by: Amanda Cockayne on: 01/30/2021 08:38 AM   Modules accepted: Orders

## 2021-02-19 DIAGNOSIS — L570 Actinic keratosis: Secondary | ICD-10-CM | POA: Diagnosis not present

## 2021-02-19 DIAGNOSIS — Z85828 Personal history of other malignant neoplasm of skin: Secondary | ICD-10-CM | POA: Diagnosis not present

## 2021-02-19 DIAGNOSIS — D485 Neoplasm of uncertain behavior of skin: Secondary | ICD-10-CM | POA: Diagnosis not present

## 2021-02-19 DIAGNOSIS — L821 Other seborrheic keratosis: Secondary | ICD-10-CM | POA: Diagnosis not present

## 2021-02-19 DIAGNOSIS — L905 Scar conditions and fibrosis of skin: Secondary | ICD-10-CM | POA: Diagnosis not present

## 2021-02-19 DIAGNOSIS — D692 Other nonthrombocytopenic purpura: Secondary | ICD-10-CM | POA: Diagnosis not present

## 2021-02-19 DIAGNOSIS — B351 Tinea unguium: Secondary | ICD-10-CM | POA: Diagnosis not present

## 2021-02-19 DIAGNOSIS — D225 Melanocytic nevi of trunk: Secondary | ICD-10-CM | POA: Diagnosis not present

## 2021-02-26 ENCOUNTER — Other Ambulatory Visit: Payer: Self-pay | Admitting: Internal Medicine

## 2021-02-26 DIAGNOSIS — E785 Hyperlipidemia, unspecified: Secondary | ICD-10-CM

## 2021-03-25 ENCOUNTER — Other Ambulatory Visit: Payer: Self-pay | Admitting: Internal Medicine

## 2021-03-25 DIAGNOSIS — I10 Essential (primary) hypertension: Secondary | ICD-10-CM

## 2021-05-07 ENCOUNTER — Telehealth: Payer: Self-pay | Admitting: *Deleted

## 2021-05-07 NOTE — Chronic Care Management (AMB) (Signed)
  Chronic Care Management   Note  05/07/2021 Name: Daniel Cochran MRN: [id number] DOB: 04-14-1941  Daniel Cochran is a 80 y.o. year old male who is a primary care patient of Isaac Bliss, Rayford Halsted, MD. I reached out to Daniel Cochran by phone today in response to a referral sent by Daniel Cochran.  Daniel Cochran was given information about Chronic Care Management services today including:  CCM service includes personalized support from designated clinical staff supervised by his physician, including individualized plan of care and coordination with other care providers 24/7 contact phone numbers for assistance for urgent and routine care needs. Service will only be billed when office clinical staff spend 20 minutes or more in a month to coordinate care. Only one practitioner may furnish and bill the service in a calendar month. The patient may stop CCM services at any time (effective at the end of the month) by phone call to the office staff. The patient is responsible for co-pay (up to 20% after annual deductible is met) if co-pay is required by the individual health plan.   Patient agreed to services and verbal consent obtained.   Follow up plan: Telephone appointment with care management team member scheduled for:06/03/21  [facility]  Direct Dial: [phone]

## 2021-05-13 ENCOUNTER — Other Ambulatory Visit: Payer: Self-pay

## 2021-05-14 ENCOUNTER — Encounter: Payer: Self-pay | Admitting: Internal Medicine

## 2021-05-14 ENCOUNTER — Ambulatory Visit (INDEPENDENT_AMBULATORY_CARE_PROVIDER_SITE_OTHER): Payer: Medicare Other | Admitting: Internal Medicine

## 2021-05-14 VITALS — BP 130/60 | HR 50 | Temp 98.2°F | Ht 72.0 in | Wt 169.9 lb

## 2021-05-14 DIAGNOSIS — Z23 Encounter for immunization: Secondary | ICD-10-CM | POA: Diagnosis not present

## 2021-05-14 DIAGNOSIS — E118 Type 2 diabetes mellitus with unspecified complications: Secondary | ICD-10-CM

## 2021-05-14 DIAGNOSIS — I10 Essential (primary) hypertension: Secondary | ICD-10-CM

## 2021-05-14 DIAGNOSIS — E785 Hyperlipidemia, unspecified: Secondary | ICD-10-CM

## 2021-05-14 LAB — POCT GLYCOSYLATED HEMOGLOBIN (HGB A1C): Hemoglobin A1C: 6.1 % — AB (ref 4.0–5.6)

## 2021-05-14 NOTE — Patient Instructions (Signed)
-  Nice seeing you today!!  -Flu vaccine today.  -Do hip stretches every day.  -Schedule follow up in 3 months.

## 2021-05-14 NOTE — Progress Notes (Signed)
Established Patient Office Visit     This visit occurred during the SARS-CoV-2 public health emergency.  Safety protocols were in place, including screening questions prior to the visit, additional usage of staff PPE, and extensive cleaning of exam room while observing appropriate contact time as indicated for disinfecting solutions.    CC/Reason for Visit: 37-month follow-up chronic medical conditions  HPI: Daniel Cochran is a 80 y.o. male who is coming in today for the above mentioned reasons. Past Medical History is significant for:  Well-controlled type 2 diabetes, hyperlipidemia, hypertension, glaucoma followed routinely by ophthalmology, vitamin D and vitamin B12 deficiency.  He has been feeling well and has no acute concerns today.  He is willing to get a flu vaccine.   Past Medical/Surgical History: Past Medical History:  Diagnosis Date   Diabetes mellitus ([place])    ED (erectile dysfunction)    Glaucoma    Hyperlipidemia    Hypertension     History reviewed. No pertinent surgical history.  Social History:  reports that he has never smoked. He has never used smokeless tobacco. He reports that he does not drink alcohol and does not use drugs.  Allergies: No Known Allergies  Family History:  Family History  Problem Relation Age of Onset   Hyperlipidemia Other    Hypertension Other      Current Outpatient Medications:    Accu-Chek FastClix Lancets MISC, Use once daily. Dx E11.9, Disp: 100 each, Rfl: 3   amLODipine (NORVASC) 10 MG tablet, Take 1 tablet (10 mg total) by mouth daily., Disp: 90 tablet, Rfl: 1   aspirin 81 MG tablet, Take 1 tablet (81 mg total) by mouth daily., Disp: 90 tablet, Rfl: 3   cloNIDine (CATAPRES) 0.1 MG tablet, Take 1 tablet by mouth twice daily, Disp: 180 tablet, Rfl: 1   cyanocobalamin (,VITAMIN B-12,) 1000 MCG/ML injection, Inject 1 ml into the deltoid once weekly for 4 weeks.  Then inject 1 ml once monthly thereafter., Disp: 1 mL,  Rfl: 11   furosemide (LASIX) 40 MG tablet, Take 1 tablet (40 mg total) by mouth 2 (two) times daily., Disp: 180 tablet, Rfl: 1   glucose blood (ACCU-CHEK AVIVA PLUS) test strip, USE ONE STRIP TO CHECK GLUCOSE ONCE DAILY, Disp: 100 each, Rfl: 4   labetalol (NORMODYNE) 200 MG tablet, Take 1 tablet by mouth twice daily, Disp: 180 tablet, Rfl: 1   latanoprost (XALATAN) 0.005 % ophthalmic solution, , Disp: , Rfl:    metFORMIN (GLUCOPHAGE) 500 MG tablet, TAKE 1 TABLET BY MOUTH BEFORE BREAKFAST AND 1/2 (ONE-HALF) BEFORE YOUR EVENING MEAL., Disp: 135 tablet, Rfl: 8   metFORMIN (GLUCOPHAGE) 500 MG tablet, TAKE ONE TABLET BY MOUTH BEFORE BREAKFAST AND 1/2 TABLET PRIOR TO YOUR EVENING MEAL, Disp: 135 tablet, Rfl: 0   sildenafil (REVATIO) 20 MG tablet, Use as directed, Disp: 20 tablet, Rfl: 10   sildenafil (VIAGRA) 100 MG tablet, Take 1 tablet (100 mg total) by mouth at bedtime as needed for erectile dysfunction (for erectile dysfunction)., Disp: 10 tablet, Rfl: 10   simvastatin (ZOCOR) 40 MG tablet, TAKE 1 TABLET BY MOUTH ONCE DAILY AT BEDTIME, Disp: 90 tablet, Rfl: 1   SYRINGE-NEEDLE, DISP, 3 ML (BD SAFETYGLIDE SYRINGE/NEEDLE) 25G X 1" 3 ML MISC, Use for B12 injections, Disp: 100 each, Rfl: 11  Review of Systems:  Constitutional: Denies fever, chills, diaphoresis, appetite change and fatigue.  HEENT: Denies photophobia, eye pain, redness, hearing loss, ear pain, congestion, sore throat, rhinorrhea, sneezing, mouth sores,  trouble swallowing, neck pain, neck stiffness and tinnitus.   Respiratory: Denies SOB, DOE, cough, chest tightness,  and wheezing.   Cardiovascular: Denies chest pain, palpitations and leg swelling.  Gastrointestinal: Denies nausea, vomiting, abdominal pain, diarrhea, constipation, blood in stool and abdominal distention.  Genitourinary: Denies dysuria, urgency, frequency, hematuria, flank pain and difficulty urinating.  Endocrine: Denies: hot or cold intolerance, sweats, changes in hair or  nails, polyuria, polydipsia. Musculoskeletal: Denies myalgias, back pain, joint swelling, arthralgias and gait problem.  Skin: Denies pallor, rash and wound.  Neurological: Denies dizziness, seizures, syncope, weakness, light-headedness, numbness and headaches.  Hematological: Denies adenopathy. Easy bruising, personal or family bleeding history  Psychiatric/Behavioral: Denies suicidal ideation, mood changes, confusion, nervousness, sleep disturbance and agitation    Physical Exam: Vitals:   05/14/21 0806  BP: 130/60  Pulse: (!) 50  Temp: 98.2 F (36.8 C)  TempSrc: Oral  SpO2: 97%  Weight: 169 lb 14.4 oz (77.1 kg)  Height: 6' (1.829 m)    Body mass index is 23.04 kg/m.   Constitutional: NAD, calm, comfortable Eyes: PERRL, lids and conjunctivae normal, wears corrective lenses ENMT: Mucous membranes are moist.  Respiratory: clear to auscultation bilaterally, no wheezing, no crackles. Normal respiratory effort. No accessory muscle use.  Cardiovascular: Regular rate and rhythm, no murmurs / rubs / gallops. No extremity edema.  Neurologic: Grossly intact and nonfocal Psychiatric: Normal judgment and insight. Alert and oriented x 3. Normal mood.    Impression and Plan:  Essential hypertension -Fair control.  Controlled type 2 diabetes mellitus with complication, without long-term current use of insulin ([place])  - Plan: POCT glycosylated hemoglobin (Hb A1C) -Excellent control with an A1c of 6.1 in office today.  Hyperlipidemia, unspecified hyperlipidemia type -Last lipid panel in July 2022 with a total cholesterol of 117, triglycerides 81 and LDL 55. -Continue simvastatin daily.  Need for influenza vaccination -Flu vaccine administered today.  Time spent: 32 minutes reviewing chart, interviewing and examining patient and formulating plan of care.   Patient Instructions  -Nice seeing you today!!  -Flu vaccine today.  -Do hip stretches every day.  -Schedule follow up  in 3 months.    Lelon Frohlich, MD [facility] at [facility]

## 2021-05-27 DIAGNOSIS — Z961 Presence of intraocular lens: Secondary | ICD-10-CM | POA: Diagnosis not present

## 2021-05-27 DIAGNOSIS — H2511 Age-related nuclear cataract, right eye: Secondary | ICD-10-CM | POA: Diagnosis not present

## 2021-05-27 DIAGNOSIS — H04123 Dry eye syndrome of bilateral lacrimal glands: Secondary | ICD-10-CM | POA: Diagnosis not present

## 2021-05-27 DIAGNOSIS — E119 Type 2 diabetes mellitus without complications: Secondary | ICD-10-CM | POA: Diagnosis not present

## 2021-05-27 DIAGNOSIS — H401111 Primary open-angle glaucoma, right eye, mild stage: Secondary | ICD-10-CM | POA: Diagnosis not present

## 2021-05-27 DIAGNOSIS — H401123 Primary open-angle glaucoma, left eye, severe stage: Secondary | ICD-10-CM | POA: Diagnosis not present

## 2021-05-30 ENCOUNTER — Telehealth: Payer: Self-pay | Admitting: *Deleted

## 2021-05-30 NOTE — Chronic Care Management (AMB) (Signed)
  Care Management   Note  05/30/2021 Name: CASSANDRA MCMANAMAN MRN: [id number] DOB: 1941/01/29  JASPER HANF is a 81 y.o. year old male who is a primary care patient of Isaac Bliss, Rayford Halsted, MD and is actively engaged with the care management team. I reached out to Tilman Neat by phone today to assist with re-scheduling an initial visit with the RN Case Manager  Follow up plan: Patient declines further follow up and engagement by the care management team. Appropriate care team members and provider have been notified via electronic communication. The care management team is available to follow up with the patient after provider conversation with the patient regarding recommendation for care management engagement and subsequent re-referral to the care management team.   [facility]  Direct Dial: [phone]

## 2021-06-03 ENCOUNTER — Telehealth: Payer: Medicare Other

## 2021-06-16 ENCOUNTER — Other Ambulatory Visit: Payer: Self-pay | Admitting: Internal Medicine

## 2021-06-16 DIAGNOSIS — I10 Essential (primary) hypertension: Secondary | ICD-10-CM

## 2021-06-27 ENCOUNTER — Other Ambulatory Visit: Payer: Self-pay | Admitting: Internal Medicine

## 2021-06-27 DIAGNOSIS — E119 Type 2 diabetes mellitus without complications: Secondary | ICD-10-CM

## 2021-08-22 DIAGNOSIS — L570 Actinic keratosis: Secondary | ICD-10-CM | POA: Diagnosis not present

## 2021-08-22 DIAGNOSIS — Z08 Encounter for follow-up examination after completed treatment for malignant neoplasm: Secondary | ICD-10-CM | POA: Diagnosis not present

## 2021-08-22 DIAGNOSIS — D225 Melanocytic nevi of trunk: Secondary | ICD-10-CM | POA: Diagnosis not present

## 2021-08-22 DIAGNOSIS — S50812A Abrasion of left forearm, initial encounter: Secondary | ICD-10-CM | POA: Diagnosis not present

## 2021-08-22 DIAGNOSIS — Z85828 Personal history of other malignant neoplasm of skin: Secondary | ICD-10-CM | POA: Diagnosis not present

## 2021-08-22 DIAGNOSIS — L821 Other seborrheic keratosis: Secondary | ICD-10-CM | POA: Diagnosis not present

## 2021-08-22 DIAGNOSIS — L814 Other melanin hyperpigmentation: Secondary | ICD-10-CM | POA: Diagnosis not present

## 2021-10-30 ENCOUNTER — Other Ambulatory Visit: Payer: Self-pay | Admitting: Internal Medicine

## 2021-10-30 DIAGNOSIS — E785 Hyperlipidemia, unspecified: Secondary | ICD-10-CM

## 2021-11-04 ENCOUNTER — Ambulatory Visit (INDEPENDENT_AMBULATORY_CARE_PROVIDER_SITE_OTHER): Payer: Medicare Other | Admitting: Internal Medicine

## 2021-11-04 ENCOUNTER — Ambulatory Visit: Payer: Medicare Other | Admitting: Internal Medicine

## 2021-11-04 VITALS — BP 147/74 | HR 62 | Temp 98.2°F | Wt 164.4 lb

## 2021-11-04 DIAGNOSIS — E861 Hypovolemia: Secondary | ICD-10-CM | POA: Diagnosis not present

## 2021-11-04 DIAGNOSIS — I9589 Other hypotension: Secondary | ICD-10-CM

## 2021-11-04 DIAGNOSIS — R55 Syncope and collapse: Secondary | ICD-10-CM

## 2021-11-04 DIAGNOSIS — E118 Type 2 diabetes mellitus with unspecified complications: Secondary | ICD-10-CM | POA: Diagnosis not present

## 2021-11-04 LAB — POCT GLYCOSYLATED HEMOGLOBIN (HGB A1C): Hemoglobin A1C: 6 % — AB (ref 4.0–5.6)

## 2021-11-04 NOTE — Patient Instructions (Signed)
-  Nice seeing you today!! ? ?-Make sure you are staying well hydrated and add a protein shake every day. ?

## 2021-11-04 NOTE — Progress Notes (Signed)
? ? ?Acute office Visit ? ? ? ? ?This visit occurred during the SARS-CoV-2 public health emergency.  Safety protocols were in place, including screening questions prior to the visit, additional usage of staff PPE, and extensive cleaning of exam room while observing appropriate contact time as indicated for disinfecting solutions.  ? ? ?CC/Reason for Visit: Near syncope ? ?HPI: Daniel Cochran is a 81 y.o. male who is coming in today for the above mentioned reasons. Past Medical History is significant for: Hypertension, hyperlipidemia, well-controlled type 2 diabetes, vitamin D and B12 deficiency, glaucoma.  This past Saturday he was at a funeral.  Shortly after the funeral had finished he felt very dizzy, he felt like there was a black curtain coming over his vision and he had some gait im balance, he stumbled and struck his nose.  A nurse was at the funeral and took his blood pressure and he was told it was 61/30.  He admits to some dehydration leading up to that event.  He has been drinking more fluids and eating more ever since.  His in office blood pressure today is 147/74.  He did not seek medical attention at that time.  He feels improved. ? ? ?Past Medical/Surgical History: ?Past Medical History:  ?Diagnosis Date  ? Diabetes mellitus ([place])   ? ED (erectile dysfunction)   ? Glaucoma   ? Hyperlipidemia   ? Hypertension   ? ? ?No past surgical history on file. ? ?Social History: ? reports that he has never smoked. He has never used smokeless tobacco. He reports that he does not drink alcohol and does not use drugs. ? ?Allergies: ?No Known Allergies ? ?Family History:  ?Family History  ?Problem Relation Age of Onset  ? Hyperlipidemia Other   ? Hypertension Other   ? ? ? ?Current Outpatient Medications:  ?  Accu-Chek FastClix Lancets MISC, Use once daily. Dx E11.9, Disp: 100 each, Rfl: 3 ?  amLODipine (NORVASC) 10 MG tablet, Take 1 tablet by mouth once daily, Disp: 90 tablet, Rfl: 1 ?  aspirin 81 MG tablet, Take  1 tablet (81 mg total) by mouth daily., Disp: 90 tablet, Rfl: 3 ?  cloNIDine (CATAPRES) 0.1 MG tablet, Take 1 tablet by mouth twice daily, Disp: 180 tablet, Rfl: 1 ?  cyanocobalamin (,VITAMIN B-12,) 1000 MCG/ML injection, Inject 1 ml into the deltoid once weekly for 4 weeks.  Then inject 1 ml once monthly thereafter., Disp: 1 mL, Rfl: 11 ?  furosemide (LASIX) 40 MG tablet, Take 1 tablet (40 mg total) by mouth 2 (two) times daily., Disp: 180 tablet, Rfl: 1 ?  glucose blood (ACCU-CHEK AVIVA PLUS) test strip, USE ONE STRIP TO CHECK GLUCOSE ONCE DAILY, Disp: 100 each, Rfl: 4 ?  labetalol (NORMODYNE) 200 MG tablet, Take 1 tablet by mouth twice daily, Disp: 180 tablet, Rfl: 1 ?  latanoprost (XALATAN) 0.005 % ophthalmic solution, , Disp: , Rfl:  ?  metFORMIN (GLUCOPHAGE) 500 MG tablet, TAKE 1 TABLET BY MOUTH BEFORE BREAKFAST AND 1/2 (ONE-HALF) BEFORE YOUR EVENING MEAL., Disp: 135 tablet, Rfl: 8 ?  metFORMIN (GLUCOPHAGE) 500 MG tablet, TAKE 1 TABLET BY MOUTH BEFORE BREAKFAST AND 1/2 (ONE-HALF) TABLET PRIOR TO YOUR EVENING MEAL, Disp: 135 tablet, Rfl: 1 ?  sildenafil (REVATIO) 20 MG tablet, Use as directed, Disp: 20 tablet, Rfl: 10 ?  sildenafil (VIAGRA) 100 MG tablet, Take 1 tablet (100 mg total) by mouth at bedtime as needed for erectile dysfunction (for erectile dysfunction)., Disp: 10 tablet, Rfl: 10 ?  simvastatin (ZOCOR) 40 MG tablet, TAKE 1 TABLET BY MOUTH ONCE DAILY AT BEDTIME, Disp: 90 tablet, Rfl: 0 ?  SYRINGE-NEEDLE, DISP, 3 ML (BD SAFETYGLIDE SYRINGE/NEEDLE) 25G X 1" 3 ML MISC, Use for B12 injections, Disp: 100 each, Rfl: 11 ? ?Review of Systems:  ?Constitutional: Denies fever, chills, diaphoresis, appetite change and fatigue.  ?HEENT: Denies photophobia, eye pain, redness, hearing loss, ear pain, congestion, sore throat, rhinorrhea, sneezing, mouth sores, trouble swallowing, neck pain, neck stiffness and tinnitus.   ?Respiratory: Denies SOB, DOE, cough, chest tightness,  and wheezing.   ?Cardiovascular: Denies  chest pain, palpitations and leg swelling.  ?Gastrointestinal: Denies nausea, vomiting, abdominal pain, diarrhea, constipation, blood in stool and abdominal distention.  ?Genitourinary: Denies dysuria, urgency, frequency, hematuria, flank pain and difficulty urinating.  ?Endocrine: Denies: hot or cold intolerance, sweats, changes in hair or nails, polyuria, polydipsia. ?Musculoskeletal: Denies myalgias, back pain, joint swelling, arthralgias and gait problem.  ?Skin: Denies pallor, rash and wound.  ?Neurological: Denies dizziness, seizures, syncope, weakness, light-headedness, numbness and headaches.  ?Hematological: Denies adenopathy. Easy bruising, personal or family bleeding history  ?Psychiatric/Behavioral: Denies suicidal ideation, mood changes, confusion, nervousness, sleep disturbance and agitation ? ? ? ?Physical Exam: ?Vitals:  ? 11/04/21 1426  ?BP: (!) 147/74  ?Pulse: 62  ?Temp: 98.2 ?F (36.8 ?C)  ?TempSrc: Oral  ?SpO2: 96%  ?Weight: 164 lb 6.4 oz (74.6 kg)  ? ? ?Body mass index is 22.3 kg/m?. ? ? ?Constitutional: NAD, calm, comfortable ?Eyes: PERRL, lids and conjunctivae normal, wears corrective lenses ?ENMT: Mucous membranes are moist.  ?Respiratory: clear to auscultation bilaterally, no wheezing, no crackles. Normal respiratory effort. No accessory muscle use.  ?Cardiovascular: Regular rate and rhythm, no murmurs / rubs / gallops. No extremity edema. ?Neurologic: Grossly intact and nonfocal ?Psychiatric: Normal judgment and insight. Alert and oriented x 3. Normal mood.  ? ? ?Impression and Plan: ? ?Controlled type 2 diabetes mellitus with complication, without long-term current use of insulin ([place]) - Plan: POCT glycosylated hemoglobin (Hb A1C), Microalbumin / creatinine urine ratio ? ?Near syncope ? ?Hypotension due to hypovolemia ? ?-Suspect his near syncope was due to significant hypotension at the time of the event that has since resolved.  Likely due to decreased oral intake.  His blood pressure has  normalized today.  He will continue to increase his fluid intake, he will add a protein shake as he is concerned about weight loss.  Upon chart review he has lost approximately 15 pounds over the last 2 years. ?-His A1c is stable at 6.0, microalbumin will be done today. ? ?Time spent:31 minutes reviewing chart, interviewing and examining patient and formulating plan of care. ? ? ?Patient Instructions  ?-Nice seeing you today!! ? ?-Make sure you are staying well hydrated and add a protein shake every day. ? ? ? ?Lelon Frohlich, MD ?[facility] ? ? ?

## 2021-11-05 LAB — MICROALBUMIN / CREATININE URINE RATIO
Creatinine,U: 102.9 mg/dL
Microalb Creat Ratio: 2.5 mg/g (ref 0.0–30.0)
Microalb, Ur: 2.5 mg/dL — ABNORMAL HIGH (ref 0.0–1.9)

## 2021-11-06 ENCOUNTER — Encounter: Payer: Self-pay | Admitting: Internal Medicine

## 2021-11-06 DIAGNOSIS — R809 Proteinuria, unspecified: Secondary | ICD-10-CM | POA: Insufficient documentation

## 2021-11-06 NOTE — Progress Notes (Signed)
He has microalbuminuria: protein leakage from kidneys as an early sign of kidney damage from diabetes. Due to his recent syncope with hypotension, will hold off on adding ACE-I/ARB for now, but can consider in future.

## 2021-11-07 ENCOUNTER — Telehealth: Payer: Self-pay | Admitting: Internal Medicine

## 2021-11-07 NOTE — Telephone Encounter (Signed)
Pt called back to return Rachel's call; informed pt that Daniel Cochran is OOO and will return on Mon. Pt understood.  ? ?FYI ?

## 2021-11-11 NOTE — Telephone Encounter (Signed)
Patient is aware of lab results.

## 2021-11-12 ENCOUNTER — Telehealth: Payer: Self-pay | Admitting: Internal Medicine

## 2021-11-12 NOTE — Telephone Encounter (Signed)
Patient is aware 

## 2021-11-12 NOTE — Telephone Encounter (Signed)
Pt is calling and would like rachel to return his call concerning decreasing some medications  ?

## 2021-11-12 NOTE — Telephone Encounter (Signed)
Re Lab results : ?He has microalbuminuria: protein leakage from kidneys as an early sign of kidney damage from diabetes. Due to his recent syncope with hypotension, will hold off on adding ACE-I/ARB for now, but can consider in future. ? ?Patient would like to know if he should stop amlodipine and labetalol? ?

## 2021-11-18 ENCOUNTER — Other Ambulatory Visit: Payer: Self-pay | Admitting: Internal Medicine

## 2021-11-18 DIAGNOSIS — I10 Essential (primary) hypertension: Secondary | ICD-10-CM

## 2021-12-24 DIAGNOSIS — H04123 Dry eye syndrome of bilateral lacrimal glands: Secondary | ICD-10-CM | POA: Diagnosis not present

## 2021-12-24 DIAGNOSIS — H2511 Age-related nuclear cataract, right eye: Secondary | ICD-10-CM | POA: Diagnosis not present

## 2021-12-24 DIAGNOSIS — H401111 Primary open-angle glaucoma, right eye, mild stage: Secondary | ICD-10-CM | POA: Diagnosis not present

## 2021-12-24 DIAGNOSIS — E119 Type 2 diabetes mellitus without complications: Secondary | ICD-10-CM | POA: Diagnosis not present

## 2021-12-24 DIAGNOSIS — H401123 Primary open-angle glaucoma, left eye, severe stage: Secondary | ICD-10-CM | POA: Diagnosis not present

## 2021-12-24 DIAGNOSIS — Z961 Presence of intraocular lens: Secondary | ICD-10-CM | POA: Diagnosis not present

## 2021-12-24 LAB — HM DIABETES EYE EXAM

## 2021-12-31 ENCOUNTER — Other Ambulatory Visit: Payer: Self-pay | Admitting: Internal Medicine

## 2021-12-31 DIAGNOSIS — I10 Essential (primary) hypertension: Secondary | ICD-10-CM

## 2022-02-02 ENCOUNTER — Encounter: Payer: Medicare Other | Admitting: Internal Medicine

## 2022-02-05 ENCOUNTER — Encounter: Payer: Self-pay | Admitting: Internal Medicine

## 2022-02-05 ENCOUNTER — Ambulatory Visit (INDEPENDENT_AMBULATORY_CARE_PROVIDER_SITE_OTHER): Payer: Medicare Other | Admitting: Internal Medicine

## 2022-02-05 ENCOUNTER — Other Ambulatory Visit: Payer: Self-pay | Admitting: Internal Medicine

## 2022-02-05 VITALS — BP 110/62 | HR 64 | Temp 97.8°F | Ht 71.5 in | Wt 162.3 lb

## 2022-02-05 DIAGNOSIS — E559 Vitamin D deficiency, unspecified: Secondary | ICD-10-CM

## 2022-02-05 DIAGNOSIS — I10 Essential (primary) hypertension: Secondary | ICD-10-CM

## 2022-02-05 DIAGNOSIS — E538 Deficiency of other specified B group vitamins: Secondary | ICD-10-CM

## 2022-02-05 DIAGNOSIS — Z0000 Encounter for general adult medical examination without abnormal findings: Secondary | ICD-10-CM | POA: Diagnosis not present

## 2022-02-05 DIAGNOSIS — E118 Type 2 diabetes mellitus with unspecified complications: Secondary | ICD-10-CM

## 2022-02-05 DIAGNOSIS — E785 Hyperlipidemia, unspecified: Secondary | ICD-10-CM | POA: Diagnosis not present

## 2022-02-05 LAB — CBC WITH DIFFERENTIAL/PLATELET
Basophils Absolute: 0 10*3/uL (ref 0.0–0.1)
Basophils Relative: 0.3 % (ref 0.0–3.0)
Eosinophils Absolute: 0.2 10*3/uL (ref 0.0–0.7)
Eosinophils Relative: 1.7 % (ref 0.0–5.0)
HCT: 37.4 % — ABNORMAL LOW (ref 39.0–52.0)
Hemoglobin: 12.7 g/dL — ABNORMAL LOW (ref 13.0–17.0)
Lymphocytes Relative: 24.4 % (ref 12.0–46.0)
Lymphs Abs: 2.5 10*3/uL (ref 0.7–4.0)
MCHC: 33.9 g/dL (ref 30.0–36.0)
MCV: 91.2 fl (ref 78.0–100.0)
Monocytes Absolute: 0.7 10*3/uL (ref 0.1–1.0)
Monocytes Relative: 6.5 % (ref 3.0–12.0)
Neutro Abs: 7 10*3/uL (ref 1.4–7.7)
Neutrophils Relative %: 67.1 % (ref 43.0–77.0)
Platelets: 284 10*3/uL (ref 150.0–400.0)
RBC: 4.1 Mil/uL — ABNORMAL LOW (ref 4.22–5.81)
RDW: 12.9 % (ref 11.5–15.5)
WBC: 10.4 10*3/uL (ref 4.0–10.5)

## 2022-02-05 LAB — LIPID PANEL
Cholesterol: 124 mg/dL (ref 0–200)
HDL: 40.9 mg/dL (ref >39.00)
LDL Cholesterol: 67 mg/dL (ref 0–99)
NonHDL: 83.15
Total CHOL/HDL Ratio: 3
Triglycerides: 80 mg/dL (ref 0.0–149.0)
VLDL: 16 mg/dL (ref 0.0–40.0)

## 2022-02-05 LAB — VITAMIN B12: Vitamin B-12: 142 pg/mL — ABNORMAL LOW (ref 211–911)

## 2022-02-05 LAB — MICROALBUMIN / CREATININE URINE RATIO
Creatinine,U: 120.6 mg/dL
Microalb Creat Ratio: 1.3 mg/g (ref 0.0–30.0)
Microalb, Ur: 1.5 mg/dL (ref 0.0–1.9)

## 2022-02-05 LAB — COMPREHENSIVE METABOLIC PANEL
ALT: 11 U/L (ref 0–53)
AST: 23 U/L (ref 0–37)
Albumin: 4.3 g/dL (ref 3.5–5.2)
Alkaline Phosphatase: 76 U/L (ref 39–117)
BUN: 18 mg/dL (ref 6–23)
CO2: 30 mEq/L (ref 19–32)
Calcium: 9 mg/dL (ref 8.4–10.5)
Chloride: 103 mEq/L (ref 96–112)
Creatinine, Ser: 1.08 mg/dL (ref 0.40–1.50)
GFR: 64.41 mL/min (ref >60.00)
Glucose, Bld: 123 mg/dL — ABNORMAL HIGH (ref 70–99)
Potassium: 3.5 mEq/L (ref 3.5–5.1)
Sodium: 142 mEq/L (ref 135–145)
Total Bilirubin: 0.5 mg/dL (ref 0.2–1.2)
Total Protein: 6.8 g/dL (ref 6.0–8.3)

## 2022-02-05 LAB — VITAMIN D 25 HYDROXY (VIT D DEFICIENCY, FRACTURES): VITD: 25.04 ng/mL — ABNORMAL LOW (ref 30.00–100.00)

## 2022-02-05 LAB — HEMOGLOBIN A1C: Hgb A1c MFr Bld: 6.7 % — ABNORMAL HIGH (ref 4.6–6.5)

## 2022-02-05 MED ORDER — VITAMIN D (ERGOCALCIFEROL) 1.25 MG (50000 UNIT) PO CAPS: 50000.0000 [IU] | ORAL_CAPSULE | ORAL | 0 refills | Status: AC

## 2022-02-05 NOTE — Progress Notes (Signed)
Established Patient Office Visit     CC/Reason for Visit: Annual preventive exam and subsequent Medicare wellness visit  HPI: Daniel Cochran is a 81 y.o. male who is coming in today for the above mentioned reasons. Past Medical History is significant for: Hypertension, hyperlipidemia, type 2 diabetes, vitamin D and B12 deficiency, glaucoma.  He is having cataract surgery next week.  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  He is due for Tdap and a bivalent COVID-vaccine.  He no longer wishes to pursue colonoscopy.   Past Medical/Surgical History: Past Medical History:  Diagnosis Date   Diabetes mellitus ([place])    ED (erectile dysfunction)    Glaucoma    Hyperlipidemia    Hypertension     No past surgical history on file.  Social History:  reports that he has never smoked. He has never used smokeless tobacco. He reports that he does not drink alcohol and does not use drugs.  Allergies: No Known Allergies  Family History:  Family History  Problem Relation Age of Onset   Hyperlipidemia Other    Hypertension Other      Current Outpatient Medications:    Accu-Chek FastClix Lancets MISC, Use once daily. Dx E11.9, Disp: 100 each, Rfl: 3   amLODipine (NORVASC) 10 MG tablet, Take 1 tablet by mouth once daily, Disp: 90 tablet, Rfl: 0   aspirin 81 MG tablet, Take 1 tablet (81 mg total) by mouth daily., Disp: 90 tablet, Rfl: 3   cloNIDine (CATAPRES) 0.1 MG tablet, Take 1 tablet by mouth twice daily, Disp: 180 tablet, Rfl: 0   cyanocobalamin (,VITAMIN B-12,) 1000 MCG/ML injection, Inject 1 ml into the deltoid once weekly for 4 weeks.  Then inject 1 ml once monthly thereafter., Disp: 1 mL, Rfl: 11   furosemide (LASIX) 40 MG tablet, Take 1 tablet (40 mg total) by mouth 2 (two) times daily., Disp: 180 tablet, Rfl: 1   glucose blood (ACCU-CHEK AVIVA PLUS) test strip, USE ONE STRIP TO  CHECK GLUCOSE ONCE DAILY, Disp: 100 each, Rfl: 4   labetalol (NORMODYNE) 200 MG tablet, Take 1 tablet by mouth twice daily, Disp: 180 tablet, Rfl: 0   latanoprost (XALATAN) 0.005 % ophthalmic solution, , Disp: , Rfl:    metFORMIN (GLUCOPHAGE) 500 MG tablet, TAKE 1 TABLET BY MOUTH BEFORE BREAKFAST AND 1/2 (ONE-HALF) BEFORE YOUR EVENING MEAL., Disp: 135 tablet, Rfl: 8   metFORMIN (GLUCOPHAGE) 500 MG tablet, TAKE 1 TABLET BY MOUTH BEFORE BREAKFAST AND 1/2 (ONE-HALF) TABLET PRIOR TO YOUR EVENING MEAL, Disp: 135 tablet, Rfl: 1   sildenafil (REVATIO) 20 MG tablet, Use as directed, Disp: 20 tablet, Rfl: 10   sildenafil (VIAGRA) 100 MG tablet, Take 1 tablet (100 mg total) by mouth at bedtime as needed for erectile dysfunction (for erectile dysfunction)., Disp: 10 tablet, Rfl: 10   simvastatin (ZOCOR) 40 MG tablet, TAKE 1 TABLET BY MOUTH ONCE DAILY AT BEDTIME, Disp: 90 tablet, Rfl: 0   SYRINGE-NEEDLE, DISP, 3 ML (BD SAFETYGLIDE SYRINGE/NEEDLE) 25G X 1" 3 ML MISC, Use for B12 injections, Disp: 100 each, Rfl: 11  Review of Systems:  Constitutional: Denies fever, chills, diaphoresis, appetite change and fatigue.  HEENT: Denies photophobia, eye pain, redness, hearing loss, ear pain, congestion, sore throat, rhinorrhea, sneezing, mouth sores, trouble swallowing, neck pain, neck stiffness and tinnitus.   Respiratory: Denies SOB, DOE, cough, chest tightness,  and  wheezing.   Cardiovascular: Denies chest pain, palpitations and leg swelling.  Gastrointestinal: Denies nausea, vomiting, abdominal pain, diarrhea, constipation, blood in stool and abdominal distention.  Genitourinary: Denies dysuria, urgency, frequency, hematuria, flank pain and difficulty urinating.  Endocrine: Denies: hot or cold intolerance, sweats, changes in hair or nails, polyuria, polydipsia. Musculoskeletal: Denies myalgias, back pain, joint swelling, arthralgias and gait problem.  Skin: Denies pallor, rash and wound.  Neurological: Denies  dizziness, seizures, syncope, weakness, light-headedness, numbness and headaches.  Hematological: Denies adenopathy. Easy bruising, personal or family bleeding history  Psychiatric/Behavioral: Denies suicidal ideation, mood changes, confusion, nervousness, sleep disturbance and agitation    Physical Exam: Vitals:   02/05/22 0909  BP: 110/62  Pulse: 64  Temp: 97.8 F (36.6 C)  TempSrc: Oral  SpO2: 98%  Weight: 162 lb 4.8 oz (73.6 kg)  Height: 5' 11.5" (1.816 m)    Body mass index is 22.32 kg/m.   Constitutional: NAD, calm, comfortable Eyes: PERRL, lids and conjunctivae normal, wears corrective lenses ENMT: Mucous membranes are moist. Posterior pharynx clear of any exudate or lesions. Normal dentition. Tympanic membrane is pearly white, no erythema or bulging. Neck: normal, supple, no masses, no thyromegaly Respiratory: clear to auscultation bilaterally, no wheezing, no crackles. Normal respiratory effort. No accessory muscle use.  Cardiovascular: Regular rate and rhythm, no murmurs / rubs / gallops. No extremity edema. 2+ pedal pulses. No carotid bruits.  Abdomen: no tenderness, no masses palpated. No hepatosplenomegaly. Bowel sounds positive.  Musculoskeletal: no clubbing / cyanosis. No joint deformity upper and lower extremities. Good ROM, no contractures. Normal muscle tone.  Skin: no rashes, lesions, ulcers. No induration Neurologic: CN 2-12 grossly intact. Sensation intact, DTR normal. Strength 5/5 in all 4.  Psychiatric: Normal judgment and insight. Alert and oriented x 3. Normal mood.    Subsequent Medicare wellness visit   1. Risk factors, based on past  M,S,F -cardiovascular disease risk factors include age, gender, history of hypertension, hyperlipidemia, type 2 diabetes   2.  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  Depression/mood: Stable, not depressed   4.  Hearing: No perceived issues   5.  ADL's: Independent in all ADLs   6.  Fall  risk: Low fall risk   7.  Home safety: No problems identified   8.  Height weight, and visual acuity: height and weight as above, vision:  Vision Screening   Right eye Left eye Both eyes  Without correction     With correction '20/52 20/40 20/25 '$     9.  Counseling: Advised to update vaccination status   10. Lab orders based on risk factors: Laboratory update will be reviewed   11. Referral : None today   12. Care plan: Follow-up with me in 3 to 4 months   13. Cognitive assessment: No cognitive impairment   14. Screening: Patient provided with a written and personalized 5-10 year screening schedule in the AVS. yes   15. Provider List Update: PCP only  16. Advance Directives: Full code   17. Opioids: Patient is not on any opioid prescriptions and has no risk factors for a substance use disorder.   [place] Office Visit from 11/04/2021 in [place] at [facility] Total Score 14          08/19/2018    1:03 PM 12/20/2018    7:49 AM 02/08/2020    9:29 AM 01/30/2021    7:54 AM 11/04/2021    2:33 PM  Fall  Risk  Falls in the past year? '1 1 1 '$ 0 1  Was there an injury with Fall? 1 1 0 0 1  Fall Risk Category Calculator '3 2 1 '$ 0 3  Fall Risk Category High Moderate Low Low High  Patient Fall Risk Level     High fall risk  Patient at Risk for Falls Due to     Other (Comment)  Fall risk Follow up     Falls evaluation completed     Impression and Plan:  Encounter for preventive health examination -Recommend routine eye and dental care. -Immunizations: Due for Tdap and bivalent COVID-vaccine, he will get these at pharmacy -Healthy lifestyle discussed in detail. -Labs to be updated today. -Colon cancer screening: No further due to age -Breast cancer screening: Not applicable -Cervical cancer screening: Not applicable -Lung cancer screening: Not applicable -Prostate cancer screening: Declines due to age -DEXA: Not applicable  Controlled type 2 diabetes  mellitus with complication, without long-term current use of insulin ([place])  - Plan: Urine microalbumin-creatinine with uACR, Urine microalbumin-creatinine with uACR, Hemoglobin A1c  Hyperlipidemia, unspecified hyperlipidemia type  - Plan: Lipid panel  Essential hypertension  - Plan: CBC with Differential/Platelet, Comprehensive metabolic panel -Blood pressure is well controlled on amlodipine, clonidine, labetalol.  Vitamin B12 deficiency  - Plan: Vitamin B12  Vitamin D deficiency  - Plan: VITAMIN D 25 Hydroxy (Vit-D Deficiency, Fractures)     Patient Instructions  -Nice seeing you today!!  -Lab work today; will notify you once results are available.  -Remember your COVID and tdap vaccines at the pharmacy.  -Schedule follow up in 3 months.      Lelon Frohlich, MD [facility]

## 2022-02-05 NOTE — Patient Instructions (Signed)
-  Nice seeing you today!!  -Lab work today; will notify you once results are available.  -Remember your COVID and tdap vaccines at the pharmacy.  -Schedule follow up in 3 months.

## 2022-02-10 ENCOUNTER — Encounter: Payer: Self-pay | Admitting: Internal Medicine

## 2022-02-11 ENCOUNTER — Ambulatory Visit (INDEPENDENT_AMBULATORY_CARE_PROVIDER_SITE_OTHER): Payer: Medicare Other | Admitting: *Deleted

## 2022-02-11 DIAGNOSIS — E538 Deficiency of other specified B group vitamins: Secondary | ICD-10-CM | POA: Diagnosis not present

## 2022-02-11 MED ORDER — CYANOCOBALAMIN 1000 MCG/ML IJ SOLN
1000.0000 ug | Freq: Once | INTRAMUSCULAR | Status: AC
  Administered 2022-02-11: 1000 ug via INTRAMUSCULAR

## 2022-02-11 NOTE — Progress Notes (Signed)
Per orders of Dr. Hernandez, injection of Cyanocobalamin 1000mcg given by Charrise Lardner A. Patient tolerated injection well. 

## 2022-02-12 DIAGNOSIS — H2511 Age-related nuclear cataract, right eye: Secondary | ICD-10-CM | POA: Diagnosis not present

## 2022-02-12 DIAGNOSIS — H401111 Primary open-angle glaucoma, right eye, mild stage: Secondary | ICD-10-CM | POA: Diagnosis not present

## 2022-02-13 ENCOUNTER — Other Ambulatory Visit: Payer: Self-pay | Admitting: Internal Medicine

## 2022-02-13 DIAGNOSIS — E119 Type 2 diabetes mellitus without complications: Secondary | ICD-10-CM

## 2022-02-18 ENCOUNTER — Ambulatory Visit (INDEPENDENT_AMBULATORY_CARE_PROVIDER_SITE_OTHER): Payer: Medicare Other | Admitting: *Deleted

## 2022-02-18 DIAGNOSIS — E538 Deficiency of other specified B group vitamins: Secondary | ICD-10-CM

## 2022-02-18 MED ORDER — CYANOCOBALAMIN 1000 MCG/ML IJ SOLN
1000.0000 ug | Freq: Once | INTRAMUSCULAR | Status: AC
  Administered 2022-02-18: 1000 ug via INTRAMUSCULAR

## 2022-02-18 NOTE — Progress Notes (Signed)
Per orders of Dr. Hernandez, injection of Cyanocobalamin 1000mcg given by Sada Mazzoni A. Patient tolerated injection well. 

## 2022-03-04 ENCOUNTER — Ambulatory Visit (INDEPENDENT_AMBULATORY_CARE_PROVIDER_SITE_OTHER): Payer: Medicare Other

## 2022-03-04 DIAGNOSIS — E538 Deficiency of other specified B group vitamins: Secondary | ICD-10-CM

## 2022-03-04 MED ORDER — CYANOCOBALAMIN 1000 MCG/ML IJ SOLN
1000.0000 ug | Freq: Once | INTRAMUSCULAR | Status: AC
  Administered 2022-03-04: 1000 ug via INTRAMUSCULAR

## 2022-03-04 NOTE — Progress Notes (Signed)
Pt here for monthly B12 injection per Dr. Jerilee Hoh  B12 1055mg given IM, and pt tolerated injection well.

## 2022-03-11 ENCOUNTER — Ambulatory Visit (INDEPENDENT_AMBULATORY_CARE_PROVIDER_SITE_OTHER): Payer: Medicare Other | Admitting: *Deleted

## 2022-03-11 DIAGNOSIS — E538 Deficiency of other specified B group vitamins: Secondary | ICD-10-CM

## 2022-03-11 MED ORDER — CYANOCOBALAMIN 1000 MCG/ML IJ SOLN
1000.0000 ug | Freq: Once | INTRAMUSCULAR | Status: AC
  Administered 2022-03-11: 1000 ug via INTRAMUSCULAR

## 2022-03-11 NOTE — Progress Notes (Addendum)
Per orders of Dr. Jerilee Hoh, injection of Cynocobalamin 1074mg given by FAgnes Lawrence Patient tolerated injection well.

## 2022-03-19 ENCOUNTER — Other Ambulatory Visit: Payer: Self-pay | Admitting: Internal Medicine

## 2022-03-19 DIAGNOSIS — E119 Type 2 diabetes mellitus without complications: Secondary | ICD-10-CM

## 2022-03-24 ENCOUNTER — Other Ambulatory Visit: Payer: Self-pay | Admitting: Internal Medicine

## 2022-03-24 DIAGNOSIS — I10 Essential (primary) hypertension: Secondary | ICD-10-CM

## 2022-04-11 ENCOUNTER — Other Ambulatory Visit: Payer: Self-pay | Admitting: Internal Medicine

## 2022-04-11 DIAGNOSIS — I10 Essential (primary) hypertension: Secondary | ICD-10-CM

## 2022-04-13 ENCOUNTER — Ambulatory Visit (INDEPENDENT_AMBULATORY_CARE_PROVIDER_SITE_OTHER): Payer: Medicare Other

## 2022-04-13 DIAGNOSIS — E538 Deficiency of other specified B group vitamins: Secondary | ICD-10-CM | POA: Diagnosis not present

## 2022-04-13 MED ORDER — CYANOCOBALAMIN 1000 MCG/ML IJ SOLN
1000.0000 ug | INTRAMUSCULAR | Status: AC
  Administered 2022-04-13: 1000 ug via INTRAMUSCULAR

## 2022-04-13 NOTE — Progress Notes (Signed)
Per orders of Dr. Hernandez, injection of Cyanocobalamin 1000 mcg given by Ulrick Methot L Shondale Quinley. Patient tolerated injection well.  

## 2022-04-29 DIAGNOSIS — Z85828 Personal history of other malignant neoplasm of skin: Secondary | ICD-10-CM | POA: Diagnosis not present

## 2022-04-29 DIAGNOSIS — L821 Other seborrheic keratosis: Secondary | ICD-10-CM | POA: Diagnosis not present

## 2022-04-29 DIAGNOSIS — L570 Actinic keratosis: Secondary | ICD-10-CM | POA: Diagnosis not present

## 2022-04-29 DIAGNOSIS — D225 Melanocytic nevi of trunk: Secondary | ICD-10-CM | POA: Diagnosis not present

## 2022-04-29 DIAGNOSIS — L814 Other melanin hyperpigmentation: Secondary | ICD-10-CM | POA: Diagnosis not present

## 2022-04-29 DIAGNOSIS — Z08 Encounter for follow-up examination after completed treatment for malignant neoplasm: Secondary | ICD-10-CM | POA: Diagnosis not present

## 2022-05-01 ENCOUNTER — Other Ambulatory Visit: Payer: Self-pay | Admitting: Internal Medicine

## 2022-05-01 DIAGNOSIS — I10 Essential (primary) hypertension: Secondary | ICD-10-CM

## 2022-05-08 ENCOUNTER — Other Ambulatory Visit: Payer: Self-pay | Admitting: Internal Medicine

## 2022-05-08 DIAGNOSIS — I10 Essential (primary) hypertension: Secondary | ICD-10-CM

## 2022-07-10 DIAGNOSIS — H01025 Squamous blepharitis left lower eyelid: Secondary | ICD-10-CM | POA: Diagnosis not present

## 2022-07-10 DIAGNOSIS — H401111 Primary open-angle glaucoma, right eye, mild stage: Secondary | ICD-10-CM | POA: Diagnosis not present

## 2022-07-10 DIAGNOSIS — Z961 Presence of intraocular lens: Secondary | ICD-10-CM | POA: Diagnosis not present

## 2022-07-10 DIAGNOSIS — H04123 Dry eye syndrome of bilateral lacrimal glands: Secondary | ICD-10-CM | POA: Diagnosis not present

## 2022-07-10 DIAGNOSIS — H401123 Primary open-angle glaucoma, left eye, severe stage: Secondary | ICD-10-CM | POA: Diagnosis not present

## 2022-07-10 DIAGNOSIS — H01022 Squamous blepharitis right lower eyelid: Secondary | ICD-10-CM | POA: Diagnosis not present

## 2022-07-10 DIAGNOSIS — E119 Type 2 diabetes mellitus without complications: Secondary | ICD-10-CM | POA: Diagnosis not present

## 2022-10-29 DIAGNOSIS — L578 Other skin changes due to chronic exposure to nonionizing radiation: Secondary | ICD-10-CM | POA: Diagnosis not present

## 2022-10-29 DIAGNOSIS — Z08 Encounter for follow-up examination after completed treatment for malignant neoplasm: Secondary | ICD-10-CM | POA: Diagnosis not present

## 2022-10-29 DIAGNOSIS — L821 Other seborrheic keratosis: Secondary | ICD-10-CM | POA: Diagnosis not present

## 2022-10-29 DIAGNOSIS — R233 Spontaneous ecchymoses: Secondary | ICD-10-CM | POA: Diagnosis not present

## 2022-10-29 DIAGNOSIS — Z85828 Personal history of other malignant neoplasm of skin: Secondary | ICD-10-CM | POA: Diagnosis not present

## 2022-10-29 DIAGNOSIS — L570 Actinic keratosis: Secondary | ICD-10-CM | POA: Diagnosis not present

## 2022-11-20 DIAGNOSIS — H401111 Primary open-angle glaucoma, right eye, mild stage: Secondary | ICD-10-CM | POA: Diagnosis not present

## 2022-11-20 DIAGNOSIS — H401123 Primary open-angle glaucoma, left eye, severe stage: Secondary | ICD-10-CM | POA: Diagnosis not present

## 2022-11-20 DIAGNOSIS — E119 Type 2 diabetes mellitus without complications: Secondary | ICD-10-CM | POA: Diagnosis not present

## 2022-11-20 DIAGNOSIS — H01022 Squamous blepharitis right lower eyelid: Secondary | ICD-10-CM | POA: Diagnosis not present

## 2022-11-20 DIAGNOSIS — H43813 Vitreous degeneration, bilateral: Secondary | ICD-10-CM | POA: Diagnosis not present

## 2022-11-20 DIAGNOSIS — H04123 Dry eye syndrome of bilateral lacrimal glands: Secondary | ICD-10-CM | POA: Diagnosis not present

## 2022-11-20 DIAGNOSIS — Z961 Presence of intraocular lens: Secondary | ICD-10-CM | POA: Diagnosis not present

## 2022-11-20 DIAGNOSIS — H01025 Squamous blepharitis left lower eyelid: Secondary | ICD-10-CM | POA: Diagnosis not present

## 2022-11-20 LAB — HM DIABETES EYE EXAM

## 2023-03-13 ENCOUNTER — Other Ambulatory Visit: Payer: Self-pay | Admitting: Internal Medicine

## 2023-03-13 DIAGNOSIS — I10 Essential (primary) hypertension: Secondary | ICD-10-CM

## 2023-03-17 DIAGNOSIS — Z961 Presence of intraocular lens: Secondary | ICD-10-CM | POA: Diagnosis not present

## 2023-03-17 DIAGNOSIS — H01022 Squamous blepharitis right lower eyelid: Secondary | ICD-10-CM | POA: Diagnosis not present

## 2023-03-17 DIAGNOSIS — H04123 Dry eye syndrome of bilateral lacrimal glands: Secondary | ICD-10-CM | POA: Diagnosis not present

## 2023-03-17 DIAGNOSIS — H401123 Primary open-angle glaucoma, left eye, severe stage: Secondary | ICD-10-CM | POA: Diagnosis not present

## 2023-03-17 DIAGNOSIS — H43813 Vitreous degeneration, bilateral: Secondary | ICD-10-CM | POA: Diagnosis not present

## 2023-03-17 DIAGNOSIS — E119 Type 2 diabetes mellitus without complications: Secondary | ICD-10-CM | POA: Diagnosis not present

## 2023-03-17 DIAGNOSIS — H401111 Primary open-angle glaucoma, right eye, mild stage: Secondary | ICD-10-CM | POA: Diagnosis not present

## 2023-03-17 DIAGNOSIS — H01025 Squamous blepharitis left lower eyelid: Secondary | ICD-10-CM | POA: Diagnosis not present

## 2023-03-19 ENCOUNTER — Emergency Department (HOSPITAL_COMMUNITY)
Admission: EM | Admit: 2023-03-19 | Discharge: 2023-03-19 | Disposition: A | Discharge status: Home / Self Care | Payer: Medicare Other

## 2023-03-19 ENCOUNTER — Encounter (HOSPITAL_COMMUNITY): Payer: Self-pay

## 2023-03-19 ENCOUNTER — Emergency Department (HOSPITAL_COMMUNITY): Payer: Medicare Other

## 2023-03-19 ENCOUNTER — Other Ambulatory Visit: Payer: Self-pay

## 2023-03-19 DIAGNOSIS — W260XXA Contact with knife, initial encounter: Secondary | ICD-10-CM | POA: Insufficient documentation

## 2023-03-19 DIAGNOSIS — Z7982 Long term (current) use of aspirin: Secondary | ICD-10-CM | POA: Diagnosis not present

## 2023-03-19 DIAGNOSIS — S61217A Laceration without foreign body of left little finger without damage to nail, initial encounter: Secondary | ICD-10-CM | POA: Insufficient documentation

## 2023-03-19 DIAGNOSIS — S61207A Unspecified open wound of left little finger without damage to nail, initial encounter: Secondary | ICD-10-CM | POA: Diagnosis not present

## 2023-03-19 DIAGNOSIS — R030 Elevated blood-pressure reading, without diagnosis of hypertension: Secondary | ICD-10-CM | POA: Diagnosis not present

## 2023-03-19 DIAGNOSIS — M19042 Primary osteoarthritis, left hand: Secondary | ICD-10-CM | POA: Diagnosis not present

## 2023-03-19 DIAGNOSIS — Z6821 Body mass index (BMI) 21.0-21.9, adult: Secondary | ICD-10-CM | POA: Diagnosis not present

## 2023-03-19 DIAGNOSIS — S6992XA Unspecified injury of left wrist, hand and finger(s), initial encounter: Secondary | ICD-10-CM | POA: Diagnosis present

## 2023-03-19 MED ORDER — DOUBLE ANTIBIOTIC 500-10000 UNIT/GM EX OINT
TOPICAL_OINTMENT | Freq: Once | CUTANEOUS | Status: AC
  Filled 2023-03-19: qty 1

## 2023-03-19 MED ORDER — LIDOCAINE HCL (PF) 1 % IJ SOLN
30.0000 mL | Freq: Once | INTRAMUSCULAR | Status: AC
  Administered 2023-03-19: 30 mL
  Filled 2023-03-19: qty 30

## 2023-03-19 MED ORDER — CEPHALEXIN 500 MG PO CAPS: 500.0000 mg | ORAL_CAPSULE | Freq: Three times a day (TID) | ORAL | 0 refills | Status: AC

## 2023-03-19 MED ORDER — TETANUS-DIPHTH-ACELL PERTUSSIS 5-2.5-18.5 LF-MCG/0.5 IM SUSY
0.5000 mL | PREFILLED_SYRINGE | Freq: Once | INTRAMUSCULAR | Status: DC
  Filled 2023-03-19 (×2): qty 0.5

## 2023-03-19 MED ORDER — CEPHALEXIN 500 MG PO CAPS
500.0000 mg | ORAL_CAPSULE | Freq: Once | ORAL | Status: AC
  Administered 2023-03-19: 500 mg via ORAL
  Filled 2023-03-19: qty 1

## 2023-03-19 NOTE — ED Triage Notes (Signed)
Pt got LEFT hand pinky finger caught in sickle around 10:30 this morning. Denies numbness. Avulsion of tip  Seen at urgent care  Has XRAY on CD Unknown if bone is involved.

## 2023-03-19 NOTE — ED Provider Notes (Cosign Needed Addendum)
[place] EMERGENCY DEPARTMENT AT [facility] Provider Note   CSN: [id number] Arrival date & time: 03/19/23  1445     History  Chief Complaint  Patient presents with   Finger Injury    Daniel Cochran is a 82 y.o. male.  Does ER for a laceration to the left fifth finger, states a sickle knife fell on his hand and his hand was pinned down, he pulled out and cut the end of his finger.  Went to urgent care was advised to come to the ER.    HPI     Home Medications Prior to Admission medications   Medication Sig Start Date End Date Taking? Authorizing Provider  cephALEXin (KEFLEX) 500 MG capsule Take 1 capsule (500 mg total) by mouth 3 (three) times daily for 5 days. 03/19/23 03/24/23 Yes Bryar Rennie A, PA-C  Accu-Chek Google Use once daily. Dx E11.9 09/17/20   Philip Aspen, Limmie Patricia, MD  amLODipine (NORVASC) 10 MG tablet Take 1 tablet by mouth once daily 03/16/23   Philip Aspen, Limmie Patricia, MD  aspirin 81 MG tablet Take 1 tablet (81 mg total) by mouth daily. 12/15/16   Nafziger, Kandee Keen, NP  cloNIDine (CATAPRES) 0.1 MG tablet Take 1 tablet by mouth twice daily 05/04/22   Philip Aspen, Limmie Patricia, MD  cyanocobalamin (,VITAMIN B-12,) 1000 MCG/ML injection Inject 1 ml into the deltoid once weekly for 4 weeks.  Then inject 1 ml once monthly thereafter. 02/08/20   Philip Aspen, Limmie Patricia, MD  furosemide (LASIX) 40 MG tablet Take 1 tablet by mouth twice daily 03/24/22   Philip Aspen, Limmie Patricia, MD  glucose blood (ACCU-CHEK AVIVA PLUS) test strip USE ONE STRIP TO CHECK GLUCOSE ONCE DAILY 09/17/20   Philip Aspen, Limmie Patricia, MD  labetalol (NORMODYNE) 200 MG tablet Take 1 tablet by mouth twice daily 05/11/22   Philip Aspen, Limmie Patricia, MD  latanoprost (XALATAN) 0.005 % ophthalmic solution  11/10/16   [provider]  metFORMIN (GLUCOPHAGE) 500 MG tablet TAKE 1 TABLET BY MOUTH BEFORE BREAKFAST AND 1/2 (ONE-HALF) BEFORE YOUR EVENING MEAL. 02/08/20    Philip Aspen, Limmie Patricia, MD  metFORMIN (GLUCOPHAGE) 500 MG tablet TAKE 1 TABLET BY MOUTH BEFORE BREAKFAST AND 1/2 (ONE-HALF) TABLET PRIOR TO YOUR EVENING MEAL 03/19/22   Philip Aspen, Limmie Patricia, MD  sildenafil (REVATIO) 20 MG tablet Use as directed 12/15/16   Shirline Frees, NP  sildenafil (VIAGRA) 100 MG tablet Take 1 tablet (100 mg total) by mouth at bedtime as needed for erectile dysfunction (for erectile dysfunction). 10/28/15   Roderick Pee, MD  simvastatin (ZOCOR) 40 MG tablet TAKE 1 TABLET BY MOUTH ONCE DAILY AT BEDTIME 10/31/21   Philip Aspen, Limmie Patricia, MD  SYRINGE-NEEDLE, DISP, 3 ML (BD SAFETYGLIDE SYRINGE/NEEDLE) 25G X 1" 3 ML MISC Use for B12 injections 01/25/19   Philip Aspen, Limmie Patricia, MD      Allergies    Patient has no known allergies.    Review of Systems   Review of Systems  Physical Exam Updated Vital Signs BP (!) 152/73 (BP Location: Right Arm)   Pulse (!) 51   Temp 99.1 F (37.3 C) (Oral)   Resp 16   Ht 6' (1.829 m)   Wt 70.3 kg   SpO2 96%   BMI 21.02 kg/m  Physical Exam Vitals and nursing note reviewed.  Constitutional:      General: He is not in acute distress.    Appearance: He is well-developed.  HENT:     Head: Normocephalic and atraumatic.     Mouth/Throat:     Mouth: Mucous membranes are moist.  Eyes:     Conjunctiva/sclera: Conjunctivae normal.  Cardiovascular:     Rate and Rhythm: Normal rate and regular rhythm.     Heart sounds: No murmur heard. Pulmonary:     Effort: Pulmonary effort is normal. No respiratory distress.     Breath sounds: Normal breath sounds.  Abdominal:     Palpations: Abdomen is soft.     Tenderness: There is no abdominal tenderness.  Musculoskeletal:        General: No swelling.     Cervical back: Neck supple.     Comments: Range of motion of the left little finger against resistance is intact  Skin:    General: Skin is warm and dry.     Capillary Refill: Capillary refill takes less than 2 seconds.      Comments: Soft tissue to the lower aspect of the left little finger distal phalanx is mostly avulsed but attached distally  Neurological:     General: No focal deficit present.     Mental Status: He is alert and oriented to person, place, and time.  Psychiatric:        Mood and Affect: Mood normal.     ED Results / Procedures / Treatments   Labs (all labs ordered are listed, but only abnormal results are displayed) Labs Reviewed - No data to display  EKG None  Radiology DG Hand Complete Left  Result Date: 03/19/2023 CLINICAL DATA:  Avulsion injury to 5th digit EXAM: LEFT HAND - COMPLETE 3+ VIEW COMPARISON:  None Available. FINDINGS: No fracture or dislocation is seen. Moderate degenerative changes involving the 2nd through 5th DIP joints, the 2nd through 4th IP joints, and the 1st MCP and CMC joints. 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 No radiopaque foreign body is seen. 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 No fracture, dislocation, or radiopaque foreign body is seen. Electronically Signed   By: Charline Bills M.D.   On: 03/19/2023 16:04    Procedures .Marland KitchenLaceration Repair  Date/Time: 03/19/2023 7:09 PM  Performed by: Ma Rings, PA-C Authorized by: Ma Rings, PA-C   Consent:    Consent obtained:  Verbal   Consent given by:  Patient   Risks discussed:  Infection, pain, poor cosmetic result and poor wound healing   Alternatives discussed:  No treatment Universal protocol:    Patient identity confirmed:  Verbally with patient Anesthesia:    Anesthesia method:  Local infiltration (Digital block)   Local anesthetic:  Lidocaine 1% w/o epi Laceration details:    Location:  Finger   Finger location:  L small finger   Length (cm):  3 Pre-procedure details:    Preparation:  Imaging obtained to evaluate for foreign bodies Exploration:    Hemostasis achieved with:  Direct pressure   Imaging  obtained: x-ray     Imaging outcome: foreign body not noted     Wound exploration: wound explored through full range of motion and entire depth of wound visualized     Contaminated: no   Treatment:    Area cleansed with:  Povidone-iodine   Amount of cleaning:  Standard   Irrigation solution:  Sterile saline   Irrigation method:  Syringe   Debridement:  None Skin repair:    Repair method:  Sutures   Suture size:  5-0   Suture material:  Prolene   Suture technique:  Simple interrupted   Number of sutures:  7 Post-procedure details:    Dressing:  Antibiotic ointment and non-adherent dressing   Procedure completion:  Tolerated well, no immediate complications     Medications Ordered in ED Medications  Tdap (BOOSTRIX) injection 0.5 mL (0.5 mLs Intramuscular Not Given 03/19/23 1829)  cephALEXin (KEFLEX) capsule 500 mg (has no administration in time range)  polymixin-bacitracin (POLYSPORIN) ointment (has no administration in time range)  lidocaine (PF) (XYLOCAINE) 1 % injection 30 mL (30 mLs Infiltration Given by Other 03/19/23 1907)    ED Course/ Medical Decision Making/ A&P                                 Medical Decision Making DDX: Laceration, soft tissue avulsion, fracture, contusion, other  ED course: Patient here after cutting his hand today, he is partially avulsed the soft tissue on the pad of the little finger of the left hand.  The tissue is somewhat macerated and devitalized.  I did suture it down but discussed with him that this will be just to temporarily prevent infection, would not expect it to heal like a normal laceration.  Advised on wound care, given tetanus at urgent care, given antibiotics to prevent infection.  X-rays showed no fracture, on exam there is no exposed bone.  Patient advised to follow-up with hand specialist for wound check, advised on strict return precautions.  No tendon or nerve injury noted  Amount and/or Complexity of Data Reviewed Radiology:  ordered.  Risk OTC drugs. Prescription drug management.           Final Clinical Impression(s) / ED Diagnoses Final diagnoses:  Laceration of left little finger without foreign body without damage to nail, initial encounter    Rx / DC Orders ED Discharge Orders          Ordered    cephALEXin (KEFLEX) 500 MG capsule  3 times daily        03/19/23 1901              Josem Kaufmann 03/19/23 [address] PA-C 03/19/23 1910    Coral Spikes, DO 03/20/23 0017

## 2023-03-19 NOTE — Discharge Instructions (Addendum)
History take care of you today.  You cut most of the skin off of the end of your finger.  We tacked the skin back down.  As discussed this can likely will not heal normally but will eventually heal from underneath.  You to follow-up for a wound check with a hand specialist.  We are putting on antibiotics to prevent infection.  Come back to the ER if you have new or worsening symptoms.  Keep it clean and dry.  Apply antibiotic ointment daily.

## 2023-03-23 DIAGNOSIS — S61217A Laceration without foreign body of left little finger without damage to nail, initial encounter: Secondary | ICD-10-CM | POA: Diagnosis not present

## 2023-03-31 DIAGNOSIS — S61217A Laceration without foreign body of left little finger without damage to nail, initial encounter: Secondary | ICD-10-CM | POA: Diagnosis not present

## 2023-04-01 ENCOUNTER — Telehealth: Payer: Self-pay

## 2023-04-01 NOTE — Telephone Encounter (Signed)
Transition Care Management Unsuccessful Follow-up Telephone Call  Date of discharge and from where:  03/19/2023 [facility]  Attempts:  1st Attempt  Reason for unsuccessful TCM follow-up call:  No answer/busy  [facility]  [facility], [facility] Resource Care Guide Direct Dial: [phone]  Website: [email or web address]

## 2023-04-01 NOTE — Telephone Encounter (Signed)
Transition Care Management Unsuccessful Follow-up Telephone Call  Date of discharge and from where:  03/19/2023 [facility]  Attempts:  2nd Attempt  Reason for unsuccessful TCM follow-up call:  Left voice message  Otniel [facility]  [facility], [facility] Resource Care Guide Direct Dial: [phone]  Website: [email or web address]

## 2023-04-14 DIAGNOSIS — S61217D Laceration without foreign body of left little finger without damage to nail, subsequent encounter: Secondary | ICD-10-CM | POA: Diagnosis not present

## 2023-04-17 ENCOUNTER — Other Ambulatory Visit: Payer: Self-pay | Admitting: Internal Medicine

## 2023-04-17 DIAGNOSIS — E119 Type 2 diabetes mellitus without complications: Secondary | ICD-10-CM

## 2023-05-17 DIAGNOSIS — L570 Actinic keratosis: Secondary | ICD-10-CM | POA: Diagnosis not present

## 2023-05-17 DIAGNOSIS — L814 Other melanin hyperpigmentation: Secondary | ICD-10-CM | POA: Diagnosis not present

## 2023-05-17 DIAGNOSIS — L821 Other seborrheic keratosis: Secondary | ICD-10-CM | POA: Diagnosis not present

## 2023-05-17 DIAGNOSIS — D225 Melanocytic nevi of trunk: Secondary | ICD-10-CM | POA: Diagnosis not present

## 2023-06-24 ENCOUNTER — Encounter: Payer: Self-pay | Admitting: Internal Medicine

## 2023-06-24 ENCOUNTER — Ambulatory Visit: Payer: Medicare Other | Admitting: Internal Medicine

## 2023-06-24 VITALS — BP 183/88 | HR 58 | Temp 98.0°F | Ht 71.5 in | Wt 165.4 lb

## 2023-06-24 DIAGNOSIS — E785 Hyperlipidemia, unspecified: Secondary | ICD-10-CM

## 2023-06-24 DIAGNOSIS — Z23 Encounter for immunization: Secondary | ICD-10-CM

## 2023-06-24 DIAGNOSIS — E118 Type 2 diabetes mellitus with unspecified complications: Secondary | ICD-10-CM | POA: Diagnosis not present

## 2023-06-24 DIAGNOSIS — E538 Deficiency of other specified B group vitamins: Secondary | ICD-10-CM | POA: Diagnosis not present

## 2023-06-24 DIAGNOSIS — I10 Essential (primary) hypertension: Secondary | ICD-10-CM

## 2023-06-24 DIAGNOSIS — R413 Other amnesia: Secondary | ICD-10-CM | POA: Diagnosis not present

## 2023-06-24 DIAGNOSIS — E559 Vitamin D deficiency, unspecified: Secondary | ICD-10-CM

## 2023-06-24 DIAGNOSIS — Z7984 Long term (current) use of oral hypoglycemic drugs: Secondary | ICD-10-CM

## 2023-06-24 DIAGNOSIS — Z0000 Encounter for general adult medical examination without abnormal findings: Secondary | ICD-10-CM

## 2023-06-24 DIAGNOSIS — E119 Type 2 diabetes mellitus without complications: Secondary | ICD-10-CM

## 2023-06-24 LAB — COMPREHENSIVE METABOLIC PANEL
ALT: 14 U/L (ref 0–53)
AST: 26 U/L (ref 0–37)
Albumin: 4.4 g/dL (ref 3.5–5.2)
Alkaline Phosphatase: 84 U/L (ref 39–117)
BUN: 10 mg/dL (ref 6–23)
CO2: 28 meq/L (ref 19–32)
Calcium: 9.2 mg/dL (ref 8.4–10.5)
Chloride: 104 meq/L (ref 96–112)
Creatinine, Ser: 0.87 mg/dL (ref 0.40–1.50)
GFR: 80.21 mL/min (ref >60.00)
Glucose, Bld: 112 mg/dL — ABNORMAL HIGH (ref 70–99)
Potassium: 4 meq/L (ref 3.5–5.1)
Sodium: 140 meq/L (ref 135–145)
Total Bilirubin: 0.6 mg/dL (ref 0.2–1.2)
Total Protein: 6.7 g/dL (ref 6.0–8.3)

## 2023-06-24 LAB — CBC WITH DIFFERENTIAL/PLATELET
Basophils Absolute: 0 10*3/uL (ref 0.0–0.1)
Basophils Relative: 0.3 % (ref 0.0–3.0)
Eosinophils Absolute: 0.1 10*3/uL (ref 0.0–0.7)
Eosinophils Relative: 1.3 % (ref 0.0–5.0)
HCT: 44.1 % (ref 39.0–52.0)
Hemoglobin: 14.6 g/dL (ref 13.0–17.0)
Lymphocytes Relative: 32.5 % (ref 12.0–46.0)
Lymphs Abs: 2.6 10*3/uL (ref 0.7–4.0)
MCHC: 33.1 g/dL (ref 30.0–36.0)
MCV: 95.5 fL (ref 78.0–100.0)
Monocytes Absolute: 0.5 10*3/uL (ref 0.1–1.0)
Monocytes Relative: 5.9 % (ref 3.0–12.0)
Neutro Abs: 4.7 10*3/uL (ref 1.4–7.7)
Neutrophils Relative %: 60 % (ref 43.0–77.0)
Platelets: 280 10*3/uL (ref 150.0–400.0)
RBC: 4.62 Mil/uL (ref 4.22–5.81)
RDW: 12.9 % (ref 11.5–15.5)
WBC: 7.9 10*3/uL (ref 4.0–10.5)

## 2023-06-24 LAB — HEMOGLOBIN A1C: Hgb A1c MFr Bld: 6.1 % (ref 4.6–6.5)

## 2023-06-24 LAB — LIPID PANEL
Cholesterol: 162 mg/dL (ref 0–200)
HDL: 48.9 mg/dL (ref >39.00)
LDL Cholesterol: 101 mg/dL — ABNORMAL HIGH (ref 0–99)
NonHDL: 112.82
Total CHOL/HDL Ratio: 3
Triglycerides: 58 mg/dL (ref 0.0–149.0)
VLDL: 11.6 mg/dL (ref 0.0–40.0)

## 2023-06-24 LAB — MICROALBUMIN / CREATININE URINE RATIO
Creatinine,U: 69.9 mg/dL
Microalb Creat Ratio: 18.8 mg/g (ref 0.0–30.0)
Microalb, Ur: 13.2 mg/dL — ABNORMAL HIGH (ref 0.0–1.9)

## 2023-06-24 LAB — TSH: TSH: 1.36 u[IU]/mL (ref 0.35–5.50)

## 2023-06-24 LAB — VITAMIN B12: Vitamin B-12: 111 pg/mL — ABNORMAL LOW (ref 211–911)

## 2023-06-24 LAB — VITAMIN D 25 HYDROXY (VIT D DEFICIENCY, FRACTURES): VITD: 23.22 ng/mL — ABNORMAL LOW (ref 30.00–100.00)

## 2023-06-24 MED ORDER — METFORMIN HCL 500 MG PO TABS
ORAL_TABLET | ORAL | 0 refills | Status: DC
Start: 2023-06-24 — End: 2023-08-17

## 2023-06-24 MED ORDER — SIMVASTATIN 40 MG PO TABS
40.0000 mg | ORAL_TABLET | Freq: Every day | ORAL | 1 refills | Status: DC
Start: 2023-06-24 — End: 2023-08-17

## 2023-06-24 MED ORDER — LABETALOL HCL 200 MG PO TABS
200.0000 mg | ORAL_TABLET | Freq: Two times a day (BID) | ORAL | 1 refills | Status: DC
Start: 2023-06-24 — End: 2023-08-17

## 2023-06-24 MED ORDER — CLONIDINE HCL 0.1 MG PO TABS
0.1000 mg | ORAL_TABLET | Freq: Two times a day (BID) | ORAL | 1 refills | Status: DC
Start: 2023-06-24 — End: 2023-08-17

## 2023-06-24 MED ORDER — AMLODIPINE BESYLATE 10 MG PO TABS
10.0000 mg | ORAL_TABLET | Freq: Every day | ORAL | 1 refills | Status: DC
Start: 2023-06-24 — End: 2023-08-17

## 2023-06-24 NOTE — Addendum Note (Signed)
Addended by: Kern Reap B on: 06/24/2023 01:25 PM   Modules accepted: Orders

## 2023-06-24 NOTE — Progress Notes (Signed)
Established Patient Office Visit     CC/Reason for Visit: Annual preventive exam, subsequent Medicare wellness visit and discuss acute concern  HPI: Daniel Cochran is a 82 y.o. male who is coming in today for the above mentioned reasons. Past Medical History is significant for: Hypertension, hyperlipidemia, type 2 diabetes, glaucoma, vitamin D and B12 deficiencies.  I have not seen him in over a year.  He appears more unkempt than usual.  States he has been having issues with memory loss.  He has been coming to this office for years and states that today he got lost on his way here.  Throughout our conversation he frequently stops and says "I forgot what I was about to say".  His blood pressure is elevated today and he admits to frequently forgetting to take his medication.  He says he lives with his wife independently.  Elects to defer all further cancer screening.   Past Medical/Surgical History: Past Medical History:  Diagnosis Date   Diabetes mellitus (HCC)    ED (erectile dysfunction)    Glaucoma    Hyperlipidemia    Hypertension     History reviewed. No pertinent surgical history.  Social History:  reports that he has never smoked. He has never used smokeless tobacco. He reports that he does not drink alcohol and does not use drugs.  Allergies: No Known Allergies  Family History:  Family History  Problem Relation Age of Onset   Hyperlipidemia Other    Hypertension Other      Current Outpatient Medications:    Accu-Chek FastClix Lancets MISC, Use once daily. Dx E11.9, Disp: 100 each, Rfl: 3   aspirin 81 MG tablet, Take 1 tablet (81 mg total) by mouth daily., Disp: 90 tablet, Rfl: 3   cyanocobalamin (,VITAMIN B-12,) 1000 MCG/ML injection, Inject 1 ml into the deltoid once weekly for 4 weeks.  Then inject 1 ml once monthly thereafter., Disp: 1 mL, Rfl: 11   furosemide (LASIX) 40 MG tablet, Take 1 tablet by mouth twice daily, Disp: 180 tablet, Rfl: 1   glucose  blood (ACCU-CHEK AVIVA PLUS) test strip, USE ONE STRIP TO CHECK GLUCOSE ONCE DAILY, Disp: 100 each, Rfl: 4   latanoprost (XALATAN) 0.005 % ophthalmic solution, , Disp: , Rfl:    sildenafil (REVATIO) 20 MG tablet, Use as directed, Disp: 20 tablet, Rfl: 10   sildenafil (VIAGRA) 100 MG tablet, Take 1 tablet (100 mg total) by mouth at bedtime as needed for erectile dysfunction (for erectile dysfunction)., Disp: 10 tablet, Rfl: 10   SYRINGE-NEEDLE, DISP, 3 ML (BD SAFETYGLIDE SYRINGE/NEEDLE) 25G X 1" 3 ML MISC, Use for B12 injections, Disp: 100 each, Rfl: 11   amLODipine (NORVASC) 10 MG tablet, Take 1 tablet (10 mg total) by mouth daily., Disp: 90 tablet, Rfl: 1   cloNIDine (CATAPRES) 0.1 MG tablet, Take 1 tablet (0.1 mg total) by mouth 2 (two) times daily., Disp: 180 tablet, Rfl: 1   labetalol (NORMODYNE) 200 MG tablet, Take 1 tablet (200 mg total) by mouth 2 (two) times daily., Disp: 180 tablet, Rfl: 1   metFORMIN (GLUCOPHAGE) 500 MG tablet, TAKE 1 TABLET BY MOUTH BEFORE BREAKFAST AND 1/2 (ONE-HALF) TABLET PRIOR TO YOUR EVENING MEAL, Disp: 135 tablet, Rfl: 0   simvastatin (ZOCOR) 40 MG tablet, Take 1 tablet (40 mg total) by mouth daily at 6 PM., Disp: 90 tablet, Rfl: 1  Review of Systems:  Negative unless indicated in HPI.   Physical Exam: Vitals:   06/24/23  1021 06/24/23 1028  BP: (!) 180/98 (!) 183/88  Pulse: (!) 58   Temp: 98 F (36.7 C)   TempSrc: Oral   SpO2: 99%   Weight: 165 lb 6.4 oz (75 kg)   Height: 5' 11.5" (1.816 m)     Body mass index is 22.75 kg/m.   Physical Exam Vitals reviewed.  Constitutional:      General: He is not in acute distress.    Appearance: He is not ill-appearing, toxic-appearing or diaphoretic.  HENT:     Head: Normocephalic.     Right Ear: Tympanic membrane, ear canal and external ear normal. There is no impacted cerumen.     Left Ear: Tympanic membrane, ear canal and external ear normal. There is no impacted cerumen.     Nose: Nose normal.      Mouth/Throat:     Mouth: Mucous membranes are moist.     Pharynx: Oropharynx is clear. No oropharyngeal exudate or posterior oropharyngeal erythema.  Eyes:     General: No scleral icterus.       Right eye: No discharge.        Left eye: No discharge.     Conjunctiva/sclera: Conjunctivae normal.     Pupils: Pupils are equal, round, and reactive to light.  Neck:     Vascular: No carotid bruit.  Cardiovascular:     Rate and Rhythm: Normal rate and regular rhythm.     Pulses: Normal pulses.     Heart sounds: Normal heart sounds.  Pulmonary:     Effort: Pulmonary effort is normal. No respiratory distress.     Breath sounds: Normal breath sounds.  Abdominal:     General: Abdomen is flat. Bowel sounds are normal.     Palpations: Abdomen is soft.  Musculoskeletal:        General: Normal range of motion.     Cervical back: Normal range of motion.  Skin:    General: Skin is warm and dry.  Neurological:     General: No focal deficit present.     Mental Status: He is alert and oriented to person, place, and time.  Psychiatric:        Mood and Affect: Mood normal.        Behavior: Behavior normal.     Subsequent Medicare wellness visit   1. Risk factors, based on past  M,S,F - Cardiac Risk Factors include: advanced age (>43men, >55 women);diabetes mellitus;dyslipidemia;hypertension;male gender   2.  Physical activities: Dietary issues and exercise activities discussed:      3.  Depression/mood:  Flowsheet Row Office Visit from 06/24/2023 in [facility] at [facility] Total Score 6        4.  ADL's:    06/24/2023   10:12 AM  In your present state of health, do you have any difficulty performing the following activities:  Hearing? 1  Vision? 1  Difficulty concentrating or making decisions? 1  Walking or climbing stairs? 1  Dressing or bathing? 0  Doing errands, shopping? 0  Preparing Food and eating ? N  Using the Toilet? N  In the past six  months, have you accidently leaked urine? Y  Do you have problems with loss of bowel control? N  Managing your Medications? N  Managing your Finances? N  Housekeeping or managing your Housekeeping? N     5.  Fall risk:     02/08/2020    9:29 AM 01/30/2021    7:54 AM 11/04/2021  2:33 PM 02/05/2022   10:23 AM 06/24/2023   10:15 AM  Fall Risk  Falls in the past year? 1 0 1 1 1   Was there an injury with Fall? 0 0 1 0 0  Fall Risk Category Calculator 1 0 3 2 2   Fall Risk Category (Retired) Low Low High Moderate   (RETIRED) Patient Fall Risk Level   High fall risk Moderate fall risk   Patient at Risk for Falls Due to   Other (Comment) Impaired balance/gait   Fall risk Follow up   Falls evaluation completed Falls evaluation completed Falls evaluation completed     6.  Home safety: No problems identified   7.  Height weight, and visual acuity: height and weight as above, vision/hearing: No results found.   8.  Counseling: Counseling given: Not Answered    9. Lab orders based on risk factors: Laboratory update will be reviewed   10. Cognitive assessment:        06/24/2023   10:19 AM 06/24/2023   10:16 AM  6CIT Screen  What Year?  0 points  What month?  0 points  What time?  0 points  Count back from 20  0 points  Months in reverse  4 points  Repeat phrase 10 points      11. Screening: Patient provided with a written and personalized 5-10 year screening schedule in the AVS. Health Maintenance  Topic Date Due   DTaP/Tdap/Td vaccine (3 - Td or Tdap) 07/08/2021   Hemoglobin A1C  08/08/2022   Yearly kidney function blood test for diabetes  02/06/2023   Yearly kidney health urinalysis for diabetes  02/06/2023   Flu Shot  02/18/2023   COVID-19 Vaccine (4 - 2023-24 season) 03/21/2023   Eye exam for diabetics  11/20/2023   Complete foot exam   06/23/2024   Medicare Annual Wellness Visit  06/23/2024   Pneumonia Vaccine  Completed   Zoster (Shingles) Vaccine  Completed   HPV  Vaccine  Aged Out    12. Provider List Update: Patient Care Team    Relationship Specialty Notifications Start End  Philip Aspen, Limmie Patricia, MD PCP - General Internal Medicine  08/19/18      13. Advance Directives: Does Patient Have a Medical Advance Directive?: Yes Type of Advance Directive: Healthcare Power of Attorney, Living will, Out of facility DNR (pink MOST or yellow form) Does patient want to make changes to medical advance directive?: No - Patient declined Copy of Healthcare Power of Attorney in Chart?: No - copy requested  14. Opioids: Patient is not on any opioid prescriptions and has no risk factors for a substance use disorder.   15.   Goals      Activity and Exercise Increased     Evidence-based guidance:  Review current exercise levels.  Assess patient perspective on exercise or activity level, barriers to increasing activity, motivation and readiness for change.  Recommend or set healthy exercise goal based on individual tolerance.  Encourage small steps toward making change in amount of exercise or activity.  Urge reduction of sedentary activities or screen time.  Promote group activities within the community or with family or support person.  Consider referral to rehabiliation therapist for assessment and exercise/activity plan.   Notes: would like a new tractor          I have personally reviewed and noted the following in the patient's chart:   Medical and social history Use of alcohol, tobacco or illicit drugs  Current medications and supplements Functional ability and status Nutritional status Physical activity Advanced directives List of other physicians Hospitalizations, surgeries, and ER visits in previous 12 months Vitals Screenings to include cognitive, depression, and falls Referrals and appointments  In addition, I have reviewed and discussed with patient certain preventive protocols, quality metrics, and best practice  recommendations. A written personalized care plan for preventive services as well as general preventive health recommendations were provided to patient.  Impression and Plan:  Medicare annual wellness visit, subsequent  Vitamin B12 deficiency -     Vitamin B12; Future  Vitamin D deficiency -     VITAMIN D 25 Hydroxy (Vit-D Deficiency, Fractures); Future  Controlled type 2 diabetes mellitus with complication, without long-term current use of insulin (HCC) -     Microalbumin / creatinine urine ratio; Future -     Hemoglobin A1c; Future -     CBC with Differential/Platelet; Future -     Comprehensive metabolic panel; Future  Hyperlipidemia, unspecified hyperlipidemia type -     Lipid panel; Future -     Simvastatin; Take 1 tablet (40 mg total) by mouth daily at 6 PM.  Dispense: 90 tablet; Refill: 1  Essential hypertension -     amLODIPine Besylate; Take 1 tablet (10 mg total) by mouth daily.  Dispense: 90 tablet; Refill: 1 -     cloNIDine HCl; Take 1 tablet (0.1 mg total) by mouth 2 (two) times daily.  Dispense: 180 tablet; Refill: 1 -     Labetalol HCl; Take 1 tablet (200 mg total) by mouth 2 (two) times daily.  Dispense: 180 tablet; Refill: 1  Memory loss -     TSH; Future -     Ambulatory referral to Neurology  Immunization due  Controlled type 2 diabetes mellitus without complication, without long-term current use of insulin (HCC) -     metFORMIN HCl; TAKE 1 TABLET BY MOUTH BEFORE BREAKFAST AND 1/2 (ONE-HALF) TABLET PRIOR TO YOUR EVENING MEAL  Dispense: 135 tablet; Refill: 0   -Recommend routine eye and dental care. -Healthy lifestyle discussed in detail. -Labs to be updated today. -Prostate cancer screening: Elects to defer due to age Health Maintenance  Topic Date Due   DTaP/Tdap/Td vaccine (3 - Td or Tdap) 07/08/2021   Hemoglobin A1C  08/08/2022   Yearly kidney function blood test for diabetes  02/06/2023   Yearly kidney health urinalysis for diabetes  02/06/2023    Flu Shot  02/18/2023   COVID-19 Vaccine (4 - 2023-24 season) 03/21/2023   Eye exam for diabetics  11/20/2023   Complete foot exam   06/23/2024   Medicare Annual Wellness Visit  06/23/2024   Pneumonia Vaccine  Completed   Zoster (Shingles) Vaccine  Completed   HPV Vaccine  Aged Out     -Flu vaccine administered in office today. -Concerned about obvious cognitive decline.  Check for reversible causes of dementia including TSH and B12.  Send to neurology for evaluation. -I will resend his blood pressure prescriptions.  He will schedule follow-up in 6 weeks to follow blood pressure that is quite elevated.  It is not clear as to whether he has any medications at home or whether he simply forgets to take them.    Chaya Jan, MD [place] [facility] at [facility]

## 2023-06-28 ENCOUNTER — Other Ambulatory Visit: Payer: Self-pay | Admitting: Internal Medicine

## 2023-06-28 DIAGNOSIS — E559 Vitamin D deficiency, unspecified: Secondary | ICD-10-CM

## 2023-06-28 MED ORDER — VITAMIN D (ERGOCALCIFEROL) 1.25 MG (50000 UNIT) PO CAPS
50000.0000 [IU] | ORAL_CAPSULE | ORAL | 0 refills | Status: AC
Start: 2023-06-28 — End: 2023-09-14

## 2023-06-30 ENCOUNTER — Ambulatory Visit (INDEPENDENT_AMBULATORY_CARE_PROVIDER_SITE_OTHER): Payer: Medicare Other | Admitting: *Deleted

## 2023-06-30 ENCOUNTER — Telehealth: Payer: Self-pay | Admitting: Internal Medicine

## 2023-06-30 DIAGNOSIS — E538 Deficiency of other specified B group vitamins: Secondary | ICD-10-CM | POA: Diagnosis not present

## 2023-06-30 MED ORDER — CYANOCOBALAMIN 1000 MCG/ML IJ SOLN
1000.0000 ug | Freq: Once | INTRAMUSCULAR | Status: AC
Start: 2023-06-30 — End: 2023-06-30
  Administered 2023-06-30: 1000 ug via INTRAMUSCULAR

## 2023-06-30 NOTE — Telephone Encounter (Signed)
Spoke to patient and reviewed lab results. 

## 2023-06-30 NOTE — Telephone Encounter (Signed)
Pt is calling and would like blood work result 

## 2023-06-30 NOTE — Progress Notes (Signed)
Per orders of Dr. Hernandez, injection of Cyanocobalamin 1000mcg given by Funderburk, Jo A. Patient tolerated injection well. 

## 2023-07-07 ENCOUNTER — Ambulatory Visit (INDEPENDENT_AMBULATORY_CARE_PROVIDER_SITE_OTHER): Payer: Medicare Other | Admitting: *Deleted

## 2023-07-07 DIAGNOSIS — E538 Deficiency of other specified B group vitamins: Secondary | ICD-10-CM | POA: Diagnosis not present

## 2023-07-07 MED ORDER — CYANOCOBALAMIN 1000 MCG/ML IJ SOLN
1000.0000 ug | Freq: Once | INTRAMUSCULAR | Status: AC
Start: 2023-07-07 — End: 2023-07-07
  Administered 2023-07-07: 1000 ug via INTRAMUSCULAR

## 2023-07-07 NOTE — Progress Notes (Signed)
Per orders of Dr. Hernandez, injection of Cyanocobalamin 1000mcg given by Funderburk, Jo A. Patient tolerated injection well. 

## 2023-07-16 ENCOUNTER — Telehealth: Payer: Self-pay

## 2023-07-16 ENCOUNTER — Ambulatory Visit (INDEPENDENT_AMBULATORY_CARE_PROVIDER_SITE_OTHER): Payer: Medicare Other

## 2023-07-16 DIAGNOSIS — E119 Type 2 diabetes mellitus without complications: Secondary | ICD-10-CM | POA: Diagnosis not present

## 2023-07-16 DIAGNOSIS — Z961 Presence of intraocular lens: Secondary | ICD-10-CM | POA: Diagnosis not present

## 2023-07-16 DIAGNOSIS — H401111 Primary open-angle glaucoma, right eye, mild stage: Secondary | ICD-10-CM | POA: Diagnosis not present

## 2023-07-16 DIAGNOSIS — E538 Deficiency of other specified B group vitamins: Secondary | ICD-10-CM

## 2023-07-16 DIAGNOSIS — H01022 Squamous blepharitis right lower eyelid: Secondary | ICD-10-CM | POA: Diagnosis not present

## 2023-07-16 DIAGNOSIS — H04123 Dry eye syndrome of bilateral lacrimal glands: Secondary | ICD-10-CM | POA: Diagnosis not present

## 2023-07-16 DIAGNOSIS — H01025 Squamous blepharitis left lower eyelid: Secondary | ICD-10-CM | POA: Diagnosis not present

## 2023-07-16 DIAGNOSIS — H401123 Primary open-angle glaucoma, left eye, severe stage: Secondary | ICD-10-CM | POA: Diagnosis not present

## 2023-07-16 DIAGNOSIS — H43813 Vitreous degeneration, bilateral: Secondary | ICD-10-CM | POA: Diagnosis not present

## 2023-07-16 DIAGNOSIS — H0011 Chalazion right upper eyelid: Secondary | ICD-10-CM | POA: Diagnosis not present

## 2023-07-16 MED ORDER — CYANOCOBALAMIN 1000 MCG/ML IJ SOLN
1000.0000 ug | Freq: Once | INTRAMUSCULAR | Status: AC
Start: 2023-07-16 — End: 2023-07-16
  Administered 2023-07-16: 1000 ug via INTRAMUSCULAR

## 2023-07-16 NOTE — Telephone Encounter (Signed)
Attempted to reach pt regards to scheduling an appt for B12 injection. Left a voicemail to call us back.   Pt was here for his 3rd weekly b12 injection. Needs another weekly injection schedule then change to monthly. Per provider notes on lab results.

## 2023-07-16 NOTE — Progress Notes (Signed)
Per orders of Shirline Frees, NP , injection of B12  given by Vickii Chafe on Right Deltoid . Patient tolerated injection well.

## 2023-07-23 ENCOUNTER — Ambulatory Visit (INDEPENDENT_AMBULATORY_CARE_PROVIDER_SITE_OTHER): Payer: Medicare Other

## 2023-07-23 DIAGNOSIS — E538 Deficiency of other specified B group vitamins: Secondary | ICD-10-CM

## 2023-07-23 MED ORDER — CYANOCOBALAMIN 1000 MCG/ML IJ SOLN
1000.0000 ug | Freq: Once | INTRAMUSCULAR | Status: AC
Start: 2023-07-23 — End: 2023-07-23
  Administered 2023-07-23: 1000 ug via INTRAMUSCULAR

## 2023-07-23 NOTE — Progress Notes (Signed)
 Per orders of Dr. Caryl Never, injection of B12 given by Vickii Chafe on Left Deltoid. Patient tolerated injection well.

## 2023-07-28 DIAGNOSIS — H04123 Dry eye syndrome of bilateral lacrimal glands: Secondary | ICD-10-CM | POA: Diagnosis not present

## 2023-07-28 DIAGNOSIS — H02132 Senile ectropion of right lower eyelid: Secondary | ICD-10-CM | POA: Diagnosis not present

## 2023-07-28 DIAGNOSIS — H0011 Chalazion right upper eyelid: Secondary | ICD-10-CM | POA: Diagnosis not present

## 2023-07-28 DIAGNOSIS — H01025 Squamous blepharitis left lower eyelid: Secondary | ICD-10-CM | POA: Diagnosis not present

## 2023-07-28 DIAGNOSIS — Z961 Presence of intraocular lens: Secondary | ICD-10-CM | POA: Diagnosis not present

## 2023-07-28 DIAGNOSIS — H401123 Primary open-angle glaucoma, left eye, severe stage: Secondary | ICD-10-CM | POA: Diagnosis not present

## 2023-07-28 DIAGNOSIS — H01022 Squamous blepharitis right lower eyelid: Secondary | ICD-10-CM | POA: Diagnosis not present

## 2023-07-28 DIAGNOSIS — H401111 Primary open-angle glaucoma, right eye, mild stage: Secondary | ICD-10-CM | POA: Diagnosis not present

## 2023-08-05 ENCOUNTER — Encounter: Payer: Self-pay | Admitting: Internal Medicine

## 2023-08-05 ENCOUNTER — Ambulatory Visit: Payer: Medicare Other | Admitting: Internal Medicine

## 2023-08-05 VITALS — BP 177/84 | HR 69 | Temp 97.6°F | Wt 155.6 lb

## 2023-08-05 DIAGNOSIS — R413 Other amnesia: Secondary | ICD-10-CM

## 2023-08-05 DIAGNOSIS — E782 Mixed hyperlipidemia: Secondary | ICD-10-CM

## 2023-08-05 DIAGNOSIS — I10 Essential (primary) hypertension: Secondary | ICD-10-CM

## 2023-08-05 NOTE — Progress Notes (Signed)
Established Patient Office Visit     CC/Reason for Visit: Blood pressure check  HPI: Daniel Cochran is a 83 y.o. male who is coming in today for the above mentioned reasons. Past Medical History is significant for: Hypertension, hyperlipidemia, type 2 diabetes, glaucoma, vitamin D and B12 deficiencies.  I last saw him in December 5.  Prior to that I had not seen him in over a year.  It became apparent at that visit that he was having significant cognitive decline.  Markedly elevated blood pressure suspected related to medication nonadherence.  He was asked to return today for follow-up.  Since his last visit he has lost 10 pounds.  He continues to be confused and repetitive with his questions.   Past Medical/Surgical History: Past Medical History:  Diagnosis Date   Diabetes mellitus (HCC)    ED (erectile dysfunction)    Glaucoma    Hyperlipidemia    Hypertension     History reviewed. No pertinent surgical history.  Social History:  reports that he has never smoked. He has never used smokeless tobacco. He reports that he does not drink alcohol and does not use drugs.  Allergies: No Known Allergies  Family History:  Family History  Problem Relation Age of Onset   Hyperlipidemia Other    Hypertension Other      Current Outpatient Medications:    Accu-Chek FastClix Lancets MISC, Use once daily. Dx E11.9, Disp: 100 each, Rfl: 3   amLODipine (NORVASC) 10 MG tablet, Take 1 tablet (10 mg total) by mouth daily., Disp: 90 tablet, Rfl: 1   aspirin 81 MG tablet, Take 1 tablet (81 mg total) by mouth daily., Disp: 90 tablet, Rfl: 3   cloNIDine (CATAPRES) 0.1 MG tablet, Take 1 tablet (0.1 mg total) by mouth 2 (two) times daily., Disp: 180 tablet, Rfl: 1   cyanocobalamin (,VITAMIN B-12,) 1000 MCG/ML injection, Inject 1 ml into the deltoid once weekly for 4 weeks.  Then inject 1 ml once monthly thereafter., Disp: 1 mL, Rfl: 11   furosemide (LASIX) 40 MG tablet, Take 1 tablet by  mouth twice daily, Disp: 180 tablet, Rfl: 1   glucose blood (ACCU-CHEK AVIVA PLUS) test strip, USE ONE STRIP TO CHECK GLUCOSE ONCE DAILY, Disp: 100 each, Rfl: 4   labetalol (NORMODYNE) 200 MG tablet, Take 1 tablet (200 mg total) by mouth 2 (two) times daily., Disp: 180 tablet, Rfl: 1   latanoprost (XALATAN) 0.005 % ophthalmic solution, , Disp: , Rfl:    metFORMIN (GLUCOPHAGE) 500 MG tablet, TAKE 1 TABLET BY MOUTH BEFORE BREAKFAST AND 1/2 (ONE-HALF) TABLET PRIOR TO YOUR EVENING MEAL, Disp: 135 tablet, Rfl: 0   sildenafil (REVATIO) 20 MG tablet, Use as directed, Disp: 20 tablet, Rfl: 10   sildenafil (VIAGRA) 100 MG tablet, Take 1 tablet (100 mg total) by mouth at bedtime as needed for erectile dysfunction (for erectile dysfunction)., Disp: 10 tablet, Rfl: 10   simvastatin (ZOCOR) 40 MG tablet, Take 1 tablet (40 mg total) by mouth daily at 6 PM., Disp: 90 tablet, Rfl: 1   SYRINGE-NEEDLE, DISP, 3 ML (BD SAFETYGLIDE SYRINGE/NEEDLE) 25G X 1" 3 ML MISC, Use for B12 injections, Disp: 100 each, Rfl: 11   Vitamin D, Ergocalciferol, (DRISDOL) 1.25 MG (50000 UNIT) CAPS capsule, Take 1 capsule (50,000 Units total) by mouth every 7 (seven) days for 12 doses., Disp: 12 capsule, Rfl: 0  Review of Systems:  Negative unless indicated in HPI.   Physical Exam: Vitals:   08/05/23  1103 08/05/23 1106  BP: (!) 190/100 (!) 177/84  Pulse: 69   Temp: 97.6 F (36.4 C)   TempSrc: Oral   SpO2: 98%   Weight: 155 lb 9.6 oz (70.6 kg)     Body mass index is 21.4 kg/m.   Physical Exam Vitals reviewed.  Constitutional:      Appearance: Normal appearance.  HENT:     Head: Normocephalic and atraumatic.  Eyes:     Conjunctiva/sclera: Conjunctivae normal.     Pupils: Pupils are equal, round, and reactive to light.  Cardiovascular:     Rate and Rhythm: Normal rate and regular rhythm.  Pulmonary:     Effort: Pulmonary effort is normal.     Breath sounds: Normal breath sounds.  Skin:    General: Skin is warm and  dry.  Neurological:     General: No focal deficit present.     Mental Status: He is alert.  Psychiatric:        Mood and Affect: Mood normal.        Behavior: Behavior normal.      Impression and Plan:  Memory loss -     Ambulatory referral to Home Health  Essential hypertension -     Ambulatory referral to Home Health  Mixed hyperlipidemia   -Again today blood pressure is markedly elevated leading me to believe that this is due to medication nonadherence especially given his cognitive decline and his appearing unkempt.  I will arrange for home health RN to look into medication adherence and home situation.  I will also involve social work as he may need to be placed in the future.  Referral to neurology has already been placed.  Time spent:31 minutes reviewing chart, interviewing and examining patient and formulating plan of care.     Chaya Jan, MD [facility]

## 2023-08-13 DIAGNOSIS — E782 Mixed hyperlipidemia: Secondary | ICD-10-CM | POA: Diagnosis not present

## 2023-08-13 DIAGNOSIS — M51379 Other intervertebral disc degeneration, lumbosacral region without mention of lumbar back pain or lower extremity pain: Secondary | ICD-10-CM | POA: Diagnosis not present

## 2023-08-13 DIAGNOSIS — R413 Other amnesia: Secondary | ICD-10-CM | POA: Diagnosis not present

## 2023-08-13 DIAGNOSIS — H903 Sensorineural hearing loss, bilateral: Secondary | ICD-10-CM | POA: Diagnosis not present

## 2023-08-13 DIAGNOSIS — H6993 Unspecified Eustachian tube disorder, bilateral: Secondary | ICD-10-CM | POA: Diagnosis not present

## 2023-08-13 DIAGNOSIS — E538 Deficiency of other specified B group vitamins: Secondary | ICD-10-CM | POA: Diagnosis not present

## 2023-08-13 DIAGNOSIS — Z9181 History of falling: Secondary | ICD-10-CM | POA: Diagnosis not present

## 2023-08-13 DIAGNOSIS — E559 Vitamin D deficiency, unspecified: Secondary | ICD-10-CM | POA: Diagnosis not present

## 2023-08-13 DIAGNOSIS — Z7984 Long term (current) use of oral hypoglycemic drugs: Secondary | ICD-10-CM | POA: Diagnosis not present

## 2023-08-13 DIAGNOSIS — H409 Unspecified glaucoma: Secondary | ICD-10-CM | POA: Diagnosis not present

## 2023-08-13 DIAGNOSIS — I10 Essential (primary) hypertension: Secondary | ICD-10-CM | POA: Diagnosis not present

## 2023-08-13 DIAGNOSIS — E119 Type 2 diabetes mellitus without complications: Secondary | ICD-10-CM | POA: Diagnosis not present

## 2023-08-17 ENCOUNTER — Other Ambulatory Visit: Payer: Self-pay | Admitting: Internal Medicine

## 2023-08-17 DIAGNOSIS — E785 Hyperlipidemia, unspecified: Secondary | ICD-10-CM

## 2023-08-17 DIAGNOSIS — I10 Essential (primary) hypertension: Secondary | ICD-10-CM

## 2023-08-17 DIAGNOSIS — E119 Type 2 diabetes mellitus without complications: Secondary | ICD-10-CM

## 2023-08-17 DIAGNOSIS — R413 Other amnesia: Secondary | ICD-10-CM | POA: Diagnosis not present

## 2023-08-17 DIAGNOSIS — Z7984 Long term (current) use of oral hypoglycemic drugs: Secondary | ICD-10-CM | POA: Diagnosis not present

## 2023-08-17 DIAGNOSIS — E559 Vitamin D deficiency, unspecified: Secondary | ICD-10-CM | POA: Diagnosis not present

## 2023-08-17 DIAGNOSIS — H409 Unspecified glaucoma: Secondary | ICD-10-CM | POA: Diagnosis not present

## 2023-08-17 DIAGNOSIS — H6993 Unspecified Eustachian tube disorder, bilateral: Secondary | ICD-10-CM | POA: Diagnosis not present

## 2023-08-17 DIAGNOSIS — E538 Deficiency of other specified B group vitamins: Secondary | ICD-10-CM | POA: Diagnosis not present

## 2023-08-17 DIAGNOSIS — E782 Mixed hyperlipidemia: Secondary | ICD-10-CM | POA: Diagnosis not present

## 2023-08-17 DIAGNOSIS — Z9181 History of falling: Secondary | ICD-10-CM | POA: Diagnosis not present

## 2023-08-17 DIAGNOSIS — H903 Sensorineural hearing loss, bilateral: Secondary | ICD-10-CM | POA: Diagnosis not present

## 2023-08-17 DIAGNOSIS — M51379 Other intervertebral disc degeneration, lumbosacral region without mention of lumbar back pain or lower extremity pain: Secondary | ICD-10-CM | POA: Diagnosis not present

## 2023-08-19 ENCOUNTER — Other Ambulatory Visit: Payer: Self-pay | Admitting: Internal Medicine

## 2023-08-19 DIAGNOSIS — E782 Mixed hyperlipidemia: Secondary | ICD-10-CM | POA: Diagnosis not present

## 2023-08-19 DIAGNOSIS — E559 Vitamin D deficiency, unspecified: Secondary | ICD-10-CM

## 2023-08-19 DIAGNOSIS — Z7984 Long term (current) use of oral hypoglycemic drugs: Secondary | ICD-10-CM | POA: Diagnosis not present

## 2023-08-19 DIAGNOSIS — I10 Essential (primary) hypertension: Secondary | ICD-10-CM

## 2023-08-19 DIAGNOSIS — Z9181 History of falling: Secondary | ICD-10-CM | POA: Diagnosis not present

## 2023-08-19 DIAGNOSIS — H903 Sensorineural hearing loss, bilateral: Secondary | ICD-10-CM | POA: Diagnosis not present

## 2023-08-19 DIAGNOSIS — E538 Deficiency of other specified B group vitamins: Secondary | ICD-10-CM | POA: Diagnosis not present

## 2023-08-19 DIAGNOSIS — M51379 Other intervertebral disc degeneration, lumbosacral region without mention of lumbar back pain or lower extremity pain: Secondary | ICD-10-CM | POA: Diagnosis not present

## 2023-08-19 DIAGNOSIS — H409 Unspecified glaucoma: Secondary | ICD-10-CM | POA: Diagnosis not present

## 2023-08-19 DIAGNOSIS — R413 Other amnesia: Secondary | ICD-10-CM | POA: Diagnosis not present

## 2023-08-19 DIAGNOSIS — E119 Type 2 diabetes mellitus without complications: Secondary | ICD-10-CM | POA: Diagnosis not present

## 2023-08-19 DIAGNOSIS — H6993 Unspecified Eustachian tube disorder, bilateral: Secondary | ICD-10-CM | POA: Diagnosis not present

## 2023-08-19 NOTE — Telephone Encounter (Signed)
Copied from CRM [id number]. Topic: Clinical - Medication Refill >> Aug 19, 2023  1:28 PM Elizebeth Brooking wrote: Most Recent Primary Care Visit:  Provider: Henderson Cloud  Department: [facility]  Visit Type: OFFICE VISIT  Date: 08/05/2023  Medication: erythromycin 5 mg   Has the patient contacted their pharmacy? Yes (Agent: If no, request that the patient contact the pharmacy for the refill. If patient does not wish to contact the pharmacy document the reason why and proceed with request.) (Agent: If yes, when and what did the pharmacy advise?)  Is this the correct pharmacy for this prescription? Yes If no, delete pharmacy and type the correct one.  This is the patient's preferred pharmacy:   [facility], [place] - [address] [place] [address] Phone: [phone] Fax: [phone]   Has the prescription been filled recently? No  Is the patient out of the medication? Yes  Has the patient been seen for an appointment in the last year OR does the patient have an upcoming appointment? Yes  Can we respond through MyChart? Yes  Agent: Please be advised that Rx refills may take up to 3 business days. We ask that you follow-up with your pharmacy.

## 2023-08-19 NOTE — Telephone Encounter (Signed)
Copied from CRM [id number]. Topic: Clinical - Medication Refill >> Aug 19, 2023  1:26 PM Elizebeth Brooking wrote: Most Recent Primary Care Visit:  Provider: Henderson Cloud  Department: [facility]  Visit Type: OFFICE VISIT  Date: 08/05/2023  Medication: furosemide (LASIX) 40 MG tablet Vitamin D, Ergocalciferol, (DRISDOL) 1.25 MG (50000 UNIT) CAPS capsule  Has the patient contacted their pharmacy? Yes (Agent: If no, request that the patient contact the pharmacy for the refill. If patient does not wish to contact the pharmacy document the reason why and proceed with request.) (Agent: If yes, when and what did the pharmacy advise?)  Is this the correct pharmacy for this prescription? Yes If no, delete pharmacy and type the correct one.  This is the patient's preferred pharmacy:    [facility], [place] - [address] [place] [address] Phone: [phone] Fax: [phone]   Has the prescription been filled recently? No  Is the patient out of the medication? Yes  Has the patient been seen for an appointment in the last year OR does the patient have an upcoming appointment? Yes  Can we respond through MyChart? Yes  Agent: Please be advised that Rx refills may take up to 3 business days. We ask that you follow-up with your pharmacy.

## 2023-08-20 DIAGNOSIS — Z7984 Long term (current) use of oral hypoglycemic drugs: Secondary | ICD-10-CM | POA: Diagnosis not present

## 2023-08-20 DIAGNOSIS — E559 Vitamin D deficiency, unspecified: Secondary | ICD-10-CM | POA: Diagnosis not present

## 2023-08-20 DIAGNOSIS — E119 Type 2 diabetes mellitus without complications: Secondary | ICD-10-CM | POA: Diagnosis not present

## 2023-08-20 DIAGNOSIS — R413 Other amnesia: Secondary | ICD-10-CM | POA: Diagnosis not present

## 2023-08-20 DIAGNOSIS — E538 Deficiency of other specified B group vitamins: Secondary | ICD-10-CM | POA: Diagnosis not present

## 2023-08-20 DIAGNOSIS — H409 Unspecified glaucoma: Secondary | ICD-10-CM | POA: Diagnosis not present

## 2023-08-20 DIAGNOSIS — E782 Mixed hyperlipidemia: Secondary | ICD-10-CM | POA: Diagnosis not present

## 2023-08-20 DIAGNOSIS — H903 Sensorineural hearing loss, bilateral: Secondary | ICD-10-CM | POA: Diagnosis not present

## 2023-08-20 DIAGNOSIS — I10 Essential (primary) hypertension: Secondary | ICD-10-CM | POA: Diagnosis not present

## 2023-08-20 DIAGNOSIS — H6993 Unspecified Eustachian tube disorder, bilateral: Secondary | ICD-10-CM | POA: Diagnosis not present

## 2023-08-20 DIAGNOSIS — Z9181 History of falling: Secondary | ICD-10-CM | POA: Diagnosis not present

## 2023-08-20 DIAGNOSIS — M51379 Other intervertebral disc degeneration, lumbosacral region without mention of lumbar back pain or lower extremity pain: Secondary | ICD-10-CM | POA: Diagnosis not present

## 2023-08-23 ENCOUNTER — Ambulatory Visit (INDEPENDENT_AMBULATORY_CARE_PROVIDER_SITE_OTHER): Payer: Medicare Other | Admitting: *Deleted

## 2023-08-23 DIAGNOSIS — E538 Deficiency of other specified B group vitamins: Secondary | ICD-10-CM

## 2023-08-23 MED ORDER — CYANOCOBALAMIN 1000 MCG/ML IJ SOLN
1000.0000 ug | Freq: Once | INTRAMUSCULAR | Status: AC
Start: 2023-08-23 — End: 2023-08-23
  Administered 2023-08-23: 1000 ug via INTRAMUSCULAR

## 2023-08-23 NOTE — Progress Notes (Signed)
Per orders of Dr. Hernandez, injection of B12 given by Dailyn Reith. Patient tolerated injection well.  

## 2023-08-24 ENCOUNTER — Other Ambulatory Visit: Payer: Self-pay | Admitting: Internal Medicine

## 2023-08-24 DIAGNOSIS — H409 Unspecified glaucoma: Secondary | ICD-10-CM | POA: Diagnosis not present

## 2023-08-24 DIAGNOSIS — I10 Essential (primary) hypertension: Secondary | ICD-10-CM

## 2023-08-24 DIAGNOSIS — H903 Sensorineural hearing loss, bilateral: Secondary | ICD-10-CM | POA: Diagnosis not present

## 2023-08-24 DIAGNOSIS — Z9181 History of falling: Secondary | ICD-10-CM | POA: Diagnosis not present

## 2023-08-24 DIAGNOSIS — E538 Deficiency of other specified B group vitamins: Secondary | ICD-10-CM | POA: Diagnosis not present

## 2023-08-24 DIAGNOSIS — R413 Other amnesia: Secondary | ICD-10-CM | POA: Diagnosis not present

## 2023-08-24 DIAGNOSIS — E559 Vitamin D deficiency, unspecified: Secondary | ICD-10-CM | POA: Diagnosis not present

## 2023-08-24 DIAGNOSIS — E119 Type 2 diabetes mellitus without complications: Secondary | ICD-10-CM | POA: Diagnosis not present

## 2023-08-24 DIAGNOSIS — M51379 Other intervertebral disc degeneration, lumbosacral region without mention of lumbar back pain or lower extremity pain: Secondary | ICD-10-CM | POA: Diagnosis not present

## 2023-08-24 DIAGNOSIS — E782 Mixed hyperlipidemia: Secondary | ICD-10-CM | POA: Diagnosis not present

## 2023-08-24 DIAGNOSIS — H6993 Unspecified Eustachian tube disorder, bilateral: Secondary | ICD-10-CM | POA: Diagnosis not present

## 2023-08-24 DIAGNOSIS — Z7984 Long term (current) use of oral hypoglycemic drugs: Secondary | ICD-10-CM | POA: Diagnosis not present

## 2023-08-24 MED ORDER — FUROSEMIDE 40 MG PO TABS: 40.0000 mg | ORAL_TABLET | Freq: Two times a day (BID) | ORAL | 4 refills | Status: DC

## 2023-08-24 NOTE — Telephone Encounter (Signed)
 Copied from CRM [id number]. Topic: Clinical - Medication Refill >> Aug 24, 2023 10:46 AM Pinkey ORN wrote: Most Recent Primary Care Visit:  Provider: THEOPHILUS DELMA TULLY CINDERELLA  Department: [facility]  Visit Type: OFFICE VISIT  Date: 08/05/2023  Medication: Vitamin D , Ergocalciferol , (DRISDOL ) 1.25 MG (50000 UNIT) CAPS capsule , LASIX  40 MG tablet , Erythromycin 5 MG   Has the patient contacted their pharmacy? Yes (Agent: If no, request that the patient contact the pharmacy for the refill. If patient does not wish to contact the pharmacy document the reason why and proceed with request.) (Agent: If yes, when and what did the pharmacy advise?)  Is this the correct pharmacy for this prescription? Yes If no, delete pharmacy and type the correct one.  This is the patient's preferred pharmacy:    [facility], [place] - [address] [place] [address] Phone: [phone] Fax: [phone]   Has the prescription been filled recently? No  Is the patient out of the medication? Yes  Has the patient been seen for an appointment in the last year OR does the patient have an upcoming appointment? Yes  Can we respond through MyChart? Yes  Agent: Please be advised that Rx refills may take up to 3 business days. We ask that you follow-up with your pharmacy.

## 2023-08-26 ENCOUNTER — Ambulatory Visit: Payer: Self-pay | Admitting: Internal Medicine

## 2023-08-26 ENCOUNTER — Other Ambulatory Visit: Payer: Self-pay | Admitting: Internal Medicine

## 2023-08-26 DIAGNOSIS — E559 Vitamin D deficiency, unspecified: Secondary | ICD-10-CM

## 2023-08-26 NOTE — Telephone Encounter (Signed)
 Last Fill: 06/28/23  Last OV: 08/05/23 Next OV: None Scheduled  Routing to provider for review/authorization.

## 2023-08-26 NOTE — Telephone Encounter (Signed)
 Copied from CRM [id number]. Topic: Clinical - Medication Refill >> Aug 26, 2023 10:12 AM Corin V wrote: Most Recent Primary Care Visit:  Provider: THEOPHILUS DELMA TULLY CINDERELLA  Department: [facility]  Visit Type: OFFICE VISIT  Date: 08/05/2023  Medication: Vitamin D , Ergocalciferol , (DRISDOL ) 1.25 MG (50000 UNIT) CAPS capsule  Has the patient contacted their pharmacy? Yes- Pharmacy asking for refill (Agent: If no, request that the patient contact the pharmacy for the refill. If patient does not wish to contact the pharmacy document the reason why and proceed with request.) (Agent: If yes, when and what did the pharmacy advise?)  Is this the correct pharmacy for this prescription? Yes If no, delete pharmacy and type the correct one.  This is the patient's preferred pharmacy:   [facility], [place] - [address] [place] [address] Phone: [phone] Fax: [phone]   Has the prescription been filled recently? No  Is the patient out of the medication? No  Has the patient been seen for an appointment in the last year OR does the patient have an upcoming appointment? Yes  Can we respond through MyChart? No  Agent: Please be advised that Rx refills may take up to 3 business days. We ask that you follow-up with your pharmacy.

## 2023-08-26 NOTE — Telephone Encounter (Signed)
 Attempt #1. This RN attempted to contact patient for triage regarding erythromycin ointment 5mg  med refill request. Med is not in patient's med list. Call sent to voicemail. LVM for call back. Will attempt again for triage.  Copied from CRM [id number]. Topic: Clinical - Medication Refill >> Aug 26, 2023 10:14 AM Corin V wrote: Most Recent Primary Care Visit:  Provider: THEOPHILUS DELMA TULLY CINDERELLA  Department: [facility]  Visit Type: OFFICE VISIT  Date: 08/05/2023   Medication: erythromycine oitnment 5mg    Has the patient contacted their pharmacy? Yes- Pharmacy called in the request (Agent: If no, request that the patient contact the pharmacy for the refill. If patient does not wish to contact the pharmacy document the reason why and proceed with request.) (Agent: If yes, when and what did the pharmacy advise?)   Is this the correct pharmacy for this prescription? Yes If no, delete pharmacy and type the correct one.  This is the patient's preferred pharmacy:  [facility], [place] - [address] [place] [address] Phone: [phone] Fax: [phone]     Has the prescription been filled recently? No   Is the patient out of the medication? No   Has the patient been seen for an appointment in the last year OR does the patient have an upcoming appointment? Yes   Can we respond through MyChart? No   Agent: Please be advised that Rx refills may take up to 3 business days. We ask that you follow-up with your pharmacy.

## 2023-08-26 NOTE — Telephone Encounter (Signed)
  Chief Complaint: medication request  Disposition: [] ED /[] Urgent Care (no appt availability in office) / [] Appointment(In office/virtual)/ []  [facility]/ [] Home Care/ [] Refused Recommended Disposition /[] [place] [facility]/ [x]  Follow-up with PCP Additional Notes: Requesting medication not on list. Callback attempted, voicemail left for call back. Alerting PCP for review and f/u.   Answer Assessment - Initial Assessment Questions N/A RN attempted to reach patient regarding medication request, unable to contact.  Protocols used: No Contact or Duplicate Contact Call-A-AH

## 2023-08-26 NOTE — Telephone Encounter (Signed)
 Attempt #1. This RN attempted to contact patient for triage regarding erythromycin ointment 5mg  med refill request. Med is not in patient's med list. Call sent to voicemail. LVM for call back. Will attempt again for triage. Copied from CRM [id number]. Topic: Clinical - Medication Refill >> Aug 26, 2023 10:14 AM Corin V wrote: Most Recent Primary Care Visit:  Provider: THEOPHILUS DELMA TULLY CINDERELLA  Department: [facility]  Visit Type: OFFICE VISIT  Date: 08/05/2023  Medication: erythromycine oitnment 5mg   Has the patient contacted their pharmacy? Yes- Pharmacy called in the request (Agent: If no, request that the patient contact the pharmacy for the refill. If patient does not wish to contact the pharmacy document the reason why and proceed with request.) (Agent: If yes, when and what did the pharmacy advise?)  Is this the correct pharmacy for this prescription? Yes If no, delete pharmacy and type the correct one.  This is the patient's preferred pharmacy:  [facility], [place] - [address] [place] [address] Phone: [phone] Fax: [phone]   Has the prescription been filled recently? No  Is the patient out of the medication? No  Has the patient been seen for an appointment in the last year OR does the patient have an upcoming appointment? Yes  Can we respond through MyChart? No  Agent: Please be advised that Rx refills may take up to 3 business days. We ask that you follow-up with your pharmacy. This encounter was created in error - please disregard.

## 2023-08-30 ENCOUNTER — Other Ambulatory Visit: Payer: Self-pay | Admitting: Internal Medicine

## 2023-08-30 DIAGNOSIS — E559 Vitamin D deficiency, unspecified: Secondary | ICD-10-CM

## 2023-08-30 NOTE — Telephone Encounter (Signed)
 Last Fill: 06/28/23  Last OV: 08/05/23 Next OV: None Scheduled  Routing to provider for review/authorization.

## 2023-08-30 NOTE — Telephone Encounter (Signed)
 Copied from CRM [id number]. Topic: Clinical - Medication Refill >> Aug 30, 2023 10:01 AM Winnifred Havers wrote: Most Recent Primary Care Visit:  Provider: Adron Albino  Department: [facility]  Visit Type: NURSE VISIT  Date: 08/23/2023  Medication: Vitamin D , Ergocalciferol , (DRISDOL ) 1.25 MG (50000 UNIT) CAPS capsule  Has the patient contacted their pharmacy? Yes (Agent: If no, request that the patient contact the pharmacy for the refill. If patient does not wish to contact the pharmacy document the reason why and proceed with request.) (Agent: If yes, when and what did the pharmacy advise?)  Is this the correct pharmacy for this prescription? Yes If no, delete pharmacy and type the correct one.  This is the patient's preferred pharmacy:    [facility], [place] - [address] [place] [address] Phone: [phone] Fax: [phone]   Has the prescription been filled recently? Yes  Is the patient out of the medication? Yes  Has the patient been seen for an appointment in the last year OR does the patient have an upcoming appointment? No  Can we respond through MyChart? No  Agent: Please be advised that Rx refills may take up to 3 business days. We ask that you follow-up with your pharmacy.

## 2023-08-31 DIAGNOSIS — M51379 Other intervertebral disc degeneration, lumbosacral region without mention of lumbar back pain or lower extremity pain: Secondary | ICD-10-CM | POA: Diagnosis not present

## 2023-08-31 DIAGNOSIS — E782 Mixed hyperlipidemia: Secondary | ICD-10-CM | POA: Diagnosis not present

## 2023-08-31 DIAGNOSIS — Z7984 Long term (current) use of oral hypoglycemic drugs: Secondary | ICD-10-CM | POA: Diagnosis not present

## 2023-08-31 DIAGNOSIS — H6993 Unspecified Eustachian tube disorder, bilateral: Secondary | ICD-10-CM | POA: Diagnosis not present

## 2023-08-31 DIAGNOSIS — R413 Other amnesia: Secondary | ICD-10-CM | POA: Diagnosis not present

## 2023-08-31 DIAGNOSIS — H409 Unspecified glaucoma: Secondary | ICD-10-CM | POA: Diagnosis not present

## 2023-08-31 DIAGNOSIS — E119 Type 2 diabetes mellitus without complications: Secondary | ICD-10-CM | POA: Diagnosis not present

## 2023-08-31 DIAGNOSIS — E538 Deficiency of other specified B group vitamins: Secondary | ICD-10-CM | POA: Diagnosis not present

## 2023-08-31 DIAGNOSIS — E559 Vitamin D deficiency, unspecified: Secondary | ICD-10-CM | POA: Diagnosis not present

## 2023-08-31 DIAGNOSIS — Z9181 History of falling: Secondary | ICD-10-CM | POA: Diagnosis not present

## 2023-08-31 DIAGNOSIS — H903 Sensorineural hearing loss, bilateral: Secondary | ICD-10-CM | POA: Diagnosis not present

## 2023-08-31 DIAGNOSIS — I10 Essential (primary) hypertension: Secondary | ICD-10-CM | POA: Diagnosis not present

## 2023-09-01 ENCOUNTER — Telehealth: Payer: Self-pay | Admitting: Internal Medicine

## 2023-09-01 NOTE — Telephone Encounter (Signed)
Copied from CRM [id number]. Topic: General - Other >> Sep 01, 2023  2:15 PM Turkey A wrote: Reason for CRM: [facility] called to check on Orders that were faxed on 08/17/23. Re faxing over  today

## 2023-09-02 NOTE — Telephone Encounter (Signed)
Placed in Dr Hardie Shackleton folder

## 2023-09-07 DIAGNOSIS — Z7984 Long term (current) use of oral hypoglycemic drugs: Secondary | ICD-10-CM | POA: Diagnosis not present

## 2023-09-07 DIAGNOSIS — Z9181 History of falling: Secondary | ICD-10-CM | POA: Diagnosis not present

## 2023-09-07 DIAGNOSIS — E782 Mixed hyperlipidemia: Secondary | ICD-10-CM | POA: Diagnosis not present

## 2023-09-07 DIAGNOSIS — I10 Essential (primary) hypertension: Secondary | ICD-10-CM | POA: Diagnosis not present

## 2023-09-07 DIAGNOSIS — H6993 Unspecified Eustachian tube disorder, bilateral: Secondary | ICD-10-CM | POA: Diagnosis not present

## 2023-09-07 DIAGNOSIS — E559 Vitamin D deficiency, unspecified: Secondary | ICD-10-CM | POA: Diagnosis not present

## 2023-09-07 DIAGNOSIS — R413 Other amnesia: Secondary | ICD-10-CM | POA: Diagnosis not present

## 2023-09-07 DIAGNOSIS — H409 Unspecified glaucoma: Secondary | ICD-10-CM | POA: Diagnosis not present

## 2023-09-07 DIAGNOSIS — M51379 Other intervertebral disc degeneration, lumbosacral region without mention of lumbar back pain or lower extremity pain: Secondary | ICD-10-CM | POA: Diagnosis not present

## 2023-09-07 DIAGNOSIS — H903 Sensorineural hearing loss, bilateral: Secondary | ICD-10-CM | POA: Diagnosis not present

## 2023-09-07 DIAGNOSIS — E538 Deficiency of other specified B group vitamins: Secondary | ICD-10-CM | POA: Diagnosis not present

## 2023-09-07 DIAGNOSIS — E119 Type 2 diabetes mellitus without complications: Secondary | ICD-10-CM | POA: Diagnosis not present

## 2023-09-14 DIAGNOSIS — M51379 Other intervertebral disc degeneration, lumbosacral region without mention of lumbar back pain or lower extremity pain: Secondary | ICD-10-CM | POA: Diagnosis not present

## 2023-09-14 DIAGNOSIS — Z7984 Long term (current) use of oral hypoglycemic drugs: Secondary | ICD-10-CM | POA: Diagnosis not present

## 2023-09-14 DIAGNOSIS — E119 Type 2 diabetes mellitus without complications: Secondary | ICD-10-CM | POA: Diagnosis not present

## 2023-09-14 DIAGNOSIS — E782 Mixed hyperlipidemia: Secondary | ICD-10-CM | POA: Diagnosis not present

## 2023-09-14 DIAGNOSIS — H409 Unspecified glaucoma: Secondary | ICD-10-CM | POA: Diagnosis not present

## 2023-09-14 DIAGNOSIS — I10 Essential (primary) hypertension: Secondary | ICD-10-CM | POA: Diagnosis not present

## 2023-09-14 DIAGNOSIS — H6993 Unspecified Eustachian tube disorder, bilateral: Secondary | ICD-10-CM | POA: Diagnosis not present

## 2023-09-14 DIAGNOSIS — E559 Vitamin D deficiency, unspecified: Secondary | ICD-10-CM | POA: Diagnosis not present

## 2023-09-14 DIAGNOSIS — H903 Sensorineural hearing loss, bilateral: Secondary | ICD-10-CM | POA: Diagnosis not present

## 2023-09-14 DIAGNOSIS — E538 Deficiency of other specified B group vitamins: Secondary | ICD-10-CM | POA: Diagnosis not present

## 2023-09-14 DIAGNOSIS — Z9181 History of falling: Secondary | ICD-10-CM | POA: Diagnosis not present

## 2023-09-14 DIAGNOSIS — R413 Other amnesia: Secondary | ICD-10-CM | POA: Diagnosis not present

## 2023-09-21 ENCOUNTER — Telehealth: Payer: Self-pay

## 2023-09-21 DIAGNOSIS — H01004 Unspecified blepharitis left upper eyelid: Secondary | ICD-10-CM | POA: Diagnosis not present

## 2023-09-21 DIAGNOSIS — H16211 Exposure keratoconjunctivitis, right eye: Secondary | ICD-10-CM | POA: Diagnosis not present

## 2023-09-21 DIAGNOSIS — E782 Mixed hyperlipidemia: Secondary | ICD-10-CM

## 2023-09-21 DIAGNOSIS — H0279 Other degenerative disorders of eyelid and periocular area: Secondary | ICD-10-CM | POA: Diagnosis not present

## 2023-09-21 DIAGNOSIS — H409 Unspecified glaucoma: Secondary | ICD-10-CM | POA: Diagnosis not present

## 2023-09-21 DIAGNOSIS — M51379 Other intervertebral disc degeneration, lumbosacral region without mention of lumbar back pain or lower extremity pain: Secondary | ICD-10-CM | POA: Diagnosis not present

## 2023-09-21 DIAGNOSIS — E538 Deficiency of other specified B group vitamins: Secondary | ICD-10-CM | POA: Diagnosis not present

## 2023-09-21 DIAGNOSIS — Z9181 History of falling: Secondary | ICD-10-CM | POA: Diagnosis not present

## 2023-09-21 DIAGNOSIS — H02112 Cicatricial ectropion of right lower eyelid: Secondary | ICD-10-CM | POA: Diagnosis not present

## 2023-09-21 DIAGNOSIS — H01001 Unspecified blepharitis right upper eyelid: Secondary | ICD-10-CM | POA: Diagnosis not present

## 2023-09-21 DIAGNOSIS — H04123 Dry eye syndrome of bilateral lacrimal glands: Secondary | ICD-10-CM | POA: Diagnosis not present

## 2023-09-21 DIAGNOSIS — H02532 Eyelid retraction right lower eyelid: Secondary | ICD-10-CM | POA: Diagnosis not present

## 2023-09-21 DIAGNOSIS — R413 Other amnesia: Secondary | ICD-10-CM | POA: Diagnosis not present

## 2023-09-21 DIAGNOSIS — H903 Sensorineural hearing loss, bilateral: Secondary | ICD-10-CM | POA: Diagnosis not present

## 2023-09-21 DIAGNOSIS — H02142 Spastic ectropion of right lower eyelid: Secondary | ICD-10-CM | POA: Diagnosis not present

## 2023-09-21 DIAGNOSIS — Z01818 Encounter for other preprocedural examination: Secondary | ICD-10-CM | POA: Diagnosis not present

## 2023-09-21 DIAGNOSIS — H02132 Senile ectropion of right lower eyelid: Secondary | ICD-10-CM | POA: Diagnosis not present

## 2023-09-21 DIAGNOSIS — E119 Type 2 diabetes mellitus without complications: Secondary | ICD-10-CM | POA: Diagnosis not present

## 2023-09-21 DIAGNOSIS — E118 Type 2 diabetes mellitus with unspecified complications: Secondary | ICD-10-CM

## 2023-09-21 DIAGNOSIS — Z7984 Long term (current) use of oral hypoglycemic drugs: Secondary | ICD-10-CM | POA: Diagnosis not present

## 2023-09-21 DIAGNOSIS — H04521 Eversion of right lacrimal punctum: Secondary | ICD-10-CM | POA: Diagnosis not present

## 2023-09-21 DIAGNOSIS — E559 Vitamin D deficiency, unspecified: Secondary | ICD-10-CM

## 2023-09-21 DIAGNOSIS — I10 Essential (primary) hypertension: Secondary | ICD-10-CM

## 2023-09-21 DIAGNOSIS — E785 Hyperlipidemia, unspecified: Secondary | ICD-10-CM

## 2023-09-21 DIAGNOSIS — H6993 Unspecified Eustachian tube disorder, bilateral: Secondary | ICD-10-CM | POA: Diagnosis not present

## 2023-09-21 NOTE — Telephone Encounter (Signed)
 Referral placed for pharmacology.

## 2023-09-21 NOTE — Telephone Encounter (Signed)
 Copied from CRM [id number]. Topic: General - Other >> Sep 21, 2023 10:45 AM Truddie Crumble wrote: Reason for CRM: kim from [facility] called stating they have been working with the patient on his medication to where the patient has exact care. The patient has a morning and night time pill packs. Patient is only taking one medication a day and it is suppose to be twice a day. Patient states the medication makes him sick on his stomach. When he takes the whole morning pack it makes him throw up.Patient  blood pressure is 160/80 to 190/80. CB [phone]

## 2023-09-21 NOTE — Addendum Note (Signed)
 Addended by: Kern Reap B on: 09/21/2023 12:58 PM   Modules accepted: Orders

## 2023-09-22 ENCOUNTER — Telehealth: Payer: Self-pay

## 2023-09-22 NOTE — Progress Notes (Signed)
 Care Guide Pharmacy Note  09/22/2023 Name: Daniel Cochran MRN: [id number] DOB: [birth year]  Referred By: Philip Aspen, Limmie Patricia, MD Reason for referral: Care Coordination (Outreach to schedule with Pharm d )   Daniel Cochran is a 83 y.o. year old male who is a primary care patient of Henderson Cloud, MD.  Daniel Cochran was referred to the pharmacist for assistance related to:  memory loss   Successful contact was made with the patient to discuss pharmacy services including being ready for the pharmacist to call at least 5 minutes before the scheduled appointment time and to have medication bottles and any blood pressure readings ready for review. The patient agreed to meet with the pharmacist via telephone visit on (date/time).10/04/2023  Penne Lash , RMA     [place]  [facility], [facility] Guide  Direct Dial: [phone]  Website: [email or web address]

## 2023-09-30 DIAGNOSIS — R413 Other amnesia: Secondary | ICD-10-CM | POA: Diagnosis not present

## 2023-09-30 DIAGNOSIS — E119 Type 2 diabetes mellitus without complications: Secondary | ICD-10-CM | POA: Diagnosis not present

## 2023-09-30 DIAGNOSIS — I10 Essential (primary) hypertension: Secondary | ICD-10-CM | POA: Diagnosis not present

## 2023-09-30 DIAGNOSIS — H409 Unspecified glaucoma: Secondary | ICD-10-CM | POA: Diagnosis not present

## 2023-09-30 DIAGNOSIS — M51379 Other intervertebral disc degeneration, lumbosacral region without mention of lumbar back pain or lower extremity pain: Secondary | ICD-10-CM | POA: Diagnosis not present

## 2023-09-30 DIAGNOSIS — E782 Mixed hyperlipidemia: Secondary | ICD-10-CM | POA: Diagnosis not present

## 2023-09-30 DIAGNOSIS — E559 Vitamin D deficiency, unspecified: Secondary | ICD-10-CM | POA: Diagnosis not present

## 2023-09-30 DIAGNOSIS — E538 Deficiency of other specified B group vitamins: Secondary | ICD-10-CM | POA: Diagnosis not present

## 2023-09-30 DIAGNOSIS — H6993 Unspecified Eustachian tube disorder, bilateral: Secondary | ICD-10-CM | POA: Diagnosis not present

## 2023-09-30 DIAGNOSIS — Z9181 History of falling: Secondary | ICD-10-CM | POA: Diagnosis not present

## 2023-09-30 DIAGNOSIS — Z7984 Long term (current) use of oral hypoglycemic drugs: Secondary | ICD-10-CM | POA: Diagnosis not present

## 2023-09-30 DIAGNOSIS — H903 Sensorineural hearing loss, bilateral: Secondary | ICD-10-CM | POA: Diagnosis not present

## 2023-10-04 ENCOUNTER — Other Ambulatory Visit (INDEPENDENT_AMBULATORY_CARE_PROVIDER_SITE_OTHER)

## 2023-10-04 VITALS — BP 164/82

## 2023-10-04 DIAGNOSIS — I10 Essential (primary) hypertension: Secondary | ICD-10-CM

## 2023-10-04 DIAGNOSIS — E118 Type 2 diabetes mellitus with unspecified complications: Secondary | ICD-10-CM

## 2023-10-04 DIAGNOSIS — E119 Type 2 diabetes mellitus without complications: Secondary | ICD-10-CM

## 2023-10-04 NOTE — Progress Notes (Unsigned)
 10/05/2023 Name: Daniel Cochran MRN: [id number] DOB: 1941-01-14  Chief Complaint  Patient presents with   Hypertension   Diabetes   Daniel Cochran is a 83 y.o. year old male who presented for a telephone visit.   They were referred to the pharmacist by their PCP for assistance in managing complex medication management.    Subjective:  Care Team: Primary Care Provider: Philip Aspen, Limmie Patricia, MD ; Next Scheduled Visit: none scheduled Neurologist: Melvyn Novas, MD; Next Scheduled Visit: 10/05/23  Medication Access/Adherence  Current Pharmacy:  [facility] [address], [place] - [address] [place] [address] Phone: [phone] Fax: [phone]  [facility] [address] [place] - [address] [place] [address] Phone: [phone] Fax: [phone]   Patient reports affordability concerns with their medications: No  Patient reports access/transportation concerns to their pharmacy: No  Patient reports adherence concerns with their medications:  Yes   - patient reports to skipping entire evening pill pack medications for a duration of 2 months (unsure) due to symptoms of nausea/vomiting when he was taking both morning and evening pill packs   Patients obtains medications via pill package and is not certain what specific medications he is taking. Has a home health nurse that takes his blood pressure a few times per week around 10-2pm. Nurse noted last BP to be 160/80 to 190/80s.    Diabetes:  Current medications: Metformin 500mg  twice daily (takes one tab once daily) Medications tried in the past: none  Current glucose readings: n/a  Patient denies hypoglycemic s/sx including dizziness, shakiness, sweating.  Patient denies hyperglycemic symptoms including polyuria, polydipsia, polyphagia, nocturia, neuropathy, blurred vision.  Patient reports eating chips and popcorn for evening snacks - was  previously experiencing nausea when he was taking evening medications   Hypertension:  Current medications: Clonidine 0.1mg  twice daily, labetalol 200mg  twice daily, amlodipine 10mg  daily  Medications previously tried: none  Patient does not have a validated, automated, upper arm home BP cuff Current blood pressure readings readings: 164/82; HR 50  Patient denies hypotensive s/sx including dizziness, lightheadedness.  Patient denies hypertensive symptoms including headache, chest pain, shortness of breath   Objective:  Lab Results  Component Value Date   HGBA1C 6.1 06/24/2023    Lab Results  Component Value Date   CREATININE 0.87 06/24/2023   BUN 10 06/24/2023   NA 140 06/24/2023   K 4.0 06/24/2023   CL 104 06/24/2023   CO2 28 06/24/2023    Lab Results  Component Value Date   CHOL 162 06/24/2023   HDL 48.90 06/24/2023   LDLCALC 101 (H) 06/24/2023   TRIG 58.0 06/24/2023   CHOLHDL 3 06/24/2023    Medications Reviewed Today     Reviewed by Sherrill Raring, RPH (Pharmacist) on 10/05/23 at 1033  Med List Status: <None>   Medication Order Taking? Sig Documenting Provider Last Dose Status Informant  Accu-Chek FastClix Lancets MISC [id number] No Use once daily. Dx E11.9 Philip Aspen, Limmie Patricia, MD Taking Active   amLODipine (NORVASC) 10 MG tablet [id number]  TAKE 1 TABLET(S) BY MOUTH 1 TIMES PER DAY *NEW PRESCRIPTION REQUEST* Philip Aspen, Limmie Patricia, MD  Active   aspirin 81 MG tablet [id number] No Take 1 tablet (81 mg total) by mouth daily. Nafziger, Kandee Keen, NP Taking Active   cloNIDine (CATAPRES) 0.1 MG tablet [id number]  TAKE 1 TABLET(S) BY MOUTH 2 TIMES PER DAY *NEW PRESCRIPTION REQUEST* Philip Aspen, Frazer  Y, MD  Active   cyanocobalamin (,VITAMIN B-12,) 1000 MCG/ML injection [id number] No Inject 1 ml into the deltoid once weekly for 4 weeks.  Then inject 1 ml once monthly thereafter. Philip Aspen, Limmie Patricia, MD Taking Active   furosemide (LASIX) 40 MG tablet  [id number]  Take 1 tablet (40 mg total) by mouth 2 (two) times daily. Philip Aspen, Limmie Patricia, MD  Active   glucose blood (ACCU-CHEK AVIVA PLUS) test strip [id number] No USE ONE STRIP TO CHECK GLUCOSE ONCE DAILY Philip Aspen, Limmie Patricia, MD Taking Active   labetalol (NORMODYNE) 200 MG tablet [id number]  TAKE 1 TABLET(S) BY MOUTH 2 TIMES PER DAY *NEW PRESCRIPTION REQUEST* Philip Aspen, Limmie Patricia, MD  Active   latanoprost (XALATAN) 0.005 % ophthalmic solution [id number] No  [provider] Taking Active   metFORMIN (GLUCOPHAGE) 500 MG tablet [id number]  TAKE 1 TABLET(S) BY MOUTH 2 TIMES PER DAY *NEW PRESCRIPTION REQUEST* Philip Aspen, Limmie Patricia, MD  Active   sildenafil (REVATIO) 20 MG tablet [id number] No Use as directed Shirline Frees, NP Taking Active   sildenafil (VIAGRA) 100 MG tablet [id number] No Take 1 tablet (100 mg total) by mouth at bedtime as needed for erectile dysfunction (for erectile dysfunction). Roderick Pee, MD Taking Active   simvastatin (ZOCOR) 40 MG tablet [id number]  TAKE 1 TABLET(S) BY MOUTH 1 TIMES PER DAY *NEW PRESCRIPTION REQUEST* Philip Aspen, Limmie Patricia, MD  Active   SYRINGE-NEEDLE, DISP, 3 ML (BD SAFETYGLIDE SYRINGE/NEEDLE) 25G X 1" 3 ML MISC [id number] No Use for B12 injections Philip Aspen, Limmie Patricia, MD Taking Active              Assessment/Plan:  - Recommended to resume taking evening pill pack with a meal or snack with larger sustenance, less greasy snack  Diabetes: - Currently controlled, A1c <8% - Reviewed long term cardiovascular and renal outcomes of uncontrolled blood sugar - Reviewed goal A1c, goal fasting, and goal 2 hour post prandial glucose - Reviewed dietary modifications including reducing greasy snacks, such as chips and popcorn - Recommend to check glucose once or twice daily - Educated that the symptoms of nausea and vomiting are likely attributed to taking Metformin with greasy snacks  -DECREASE Metformin to 500mg  once  daily   Hypertension: - Currently uncontrolled, BP goal <140/90 - Reviewed long term cardiovascular and renal outcomes of uncontrolled blood pressure - Reviewed appropriate blood pressure monitoring technique and reviewed goal blood pressure.  - Recommended to check home blood pressure and heart rate once or twice daily - Recommend to continue with therapy as prescribed   - Consider reducing labetalol dose if home HR readings continue to be in the lower range   Follow Up Plan: 10/06/23 (patient going to bring in pill packs to identify evening dose of metformin tab for removal  Verdene Rio, PharmD PGY1 Pharmacy Resident  Sherrill Raring, PharmD Clinical Pharmacist [phone]

## 2023-10-05 ENCOUNTER — Ambulatory Visit: Payer: Self-pay | Admitting: Neurology

## 2023-10-05 ENCOUNTER — Encounter: Payer: Self-pay | Admitting: Neurology

## 2023-10-05 VITALS — BP 172/82 | HR 51 | Ht 72.0 in | Wt 160.0 lb

## 2023-10-05 DIAGNOSIS — R419 Unspecified symptoms and signs involving cognitive functions and awareness: Secondary | ICD-10-CM | POA: Diagnosis not present

## 2023-10-05 DIAGNOSIS — F03B4 Unspecified dementia, moderate, with anxiety: Secondary | ICD-10-CM

## 2023-10-05 MED ORDER — DONEPEZIL HCL 5 MG PO TABS: 5.0000 mg | ORAL_TABLET | Freq: Every morning | ORAL | 0 refills | Status: DC

## 2023-10-05 MED ORDER — METFORMIN HCL 500 MG PO TABS: ORAL_TABLET | ORAL | 9 refills | Status: DC

## 2023-10-05 NOTE — Progress Notes (Signed)
 Daniel Cochran, Daniel Cochran, Daniel Patricia, Daniel  Chief Complaint  Patient presents with   Memory Loss    Rm 2 alone Pt is well, reports in the last 3 yrs Daniel Cochran as been having trouble with remembering people and names. Daniel Cochran has a hard time retaining things said to him.  Does ADLs alone. No family history of memory concerns Daniel Cochran is aware of.     HPI:  Daniel Cochran is a 83 y.o. male and seen here  unaccompanied upon referral  for e memory disorder, I gather Daniel Cochran drove himslef. Daniel Cochran arrived at 13.48  minutes  for an arrival time of 13.30 hours.  Cochran Blood pressure was very high when Daniel Cochran checked in.  Daniel Cochran was announced by Dr. Philip Cochran for a Consultation/ Evaluation of memory disorder.  This patient reports onset of forgetfulness  over a period of 2 , maybe 3 years. Daniel Cochran reports Daniel Cochran is doing Cochran financial stuff himself, banking,  but tax filing is done by Cochran wife. Daniel Cochran has gotten lost while driving , but had been able to find Cochran way back by himself. Daniel Cochran looses a lot of stuff around the house, misplacing things,  the glass of coke a or th  pen, the keys. And Daniel Cochran could not find Cochran chain saw.(!)  Daniel Cochran lives in a private home , with a barn and detached garage , a working  farm. Daniel Cochran raises cattle. Daniel Cochran just sold 2. Kept 18.  reports in the last 3 yrs Daniel Cochran as been having trouble with remembering people and names. Daniel Cochran has a hard time retaining things said to him. Does ADLs alone. No family history of memory concerns Daniel Cochran is aware of.   Cochran father was a Psychiatric nurse, and worked in Tobacco, Daniel Cochran died at age 63 of cancer. Mother died younger of cancer , too.   Daniel Cochran has a younger sister but she is not close to him.   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  Worked in Mus[employer]-  Married at age 40 , and  Daniel Cochran has one son  who is  in prison.   Daniel Cochran didn't smoke  or drink, caffee ( 1 cup in AM )  and tea, 2 -4 cups a day.      10/05/2023    2:00 PM  Montreal Cognitive Assessment   Visuospatial/ Executive (0/5) 0  Naming (0/3) 3  Attention: Read list of digits (0/2) 1  Attention: Read list of letters (0/1) 1  Attention: Serial 7 subtraction starting at 100 (0/3) 1  Language: Repeat phrase (0/2) 1  Language : Fluency (0/1) 0  Abstraction (0/2) 2  Delayed Recall (0/5) 0  Orientation (0/6) 5  Total 14    PCP note 08-05-2023: HPI: Daniel Cochran is a 83 y.o. male who is coming in today for the above mentioned reasons. Past Medical History is significant for: Hypertension, hyperlipidemia, type 2 diabetes, glaucoma, vitamin D and B12 deficiencies.  I last saw him in December 5.  Prior to that I had not seen him in over a year.  It became apparent at that visit that Daniel Cochran was having significant cognitive decline.  Markedly elevated blood pressure suspected related to medication nonadherence.  Daniel Cochran was asked to return today for follow-up.  Since Cochran last visit Daniel Cochran has lost 10 pounds.  Daniel Cochran  continues to be confused and repetitive with Cochran questions. Again today blood pressure is markedly elevated leading me to believe that this is due to medication nonadherence especially given Cochran cognitive decline and Cochran appearing unkempt. I will arrange for home health RN to look into medication adherence and home situation. I will also involve social work as Daniel Cochran may need to be placed in the future. Referral to neurology has already been placed.   Cochran B 12 was rechecked , Daniel Cochran was referred to social work  Home health- ,  and noted Cochran BP on that date.   Review of Systems: Out of a complete 14 system review, the patient complains of only the following symptoms, and all other reviewed systems are negative.    Social History   Socioeconomic History   Marital status: Married    Spouse name: Not on file   Number of children: Not on file   Years of education: Not on file   Highest  education level: Not on file  Occupational History   Not on file  Tobacco Use   Smoking status: Never   Smokeless tobacco: Never  Substance and Sexual Activity   Alcohol use: No   Drug use: No   Sexual activity: Not on file  Other Topics Concern   Not on file  Social History Narrative   Not on file   Social Drivers of Health   Financial Resource Strain: Low Risk  (06/24/2023)   Overall Financial Resource Strain (CARDIA)    Difficulty of Paying Living Expenses: Not hard at all  Food Insecurity: No Food Insecurity (06/24/2023)   Hunger Vital Sign    Worried About Running Out of Food in the Last Year: Never true    Ran Out of Food in the Last Year: Never true  Transportation Needs: No Transportation Needs (06/24/2023)   PRAPARE - Administrator, Civil Service (Medical): No    Lack of Transportation (Non-Medical): No  Physical Activity: Sufficiently Active (06/24/2023)   Exercise Vital Sign    Days of Exercise per Week: 7 days    Minutes of Exercise per Session: 60 min  Stress: Stress Concern Present (06/24/2023)   [employer] of Occupational Health - Occupational Stress Questionnaire    Feeling of Stress : To some extent  Social Connections: Moderately Integrated (06/24/2023)   Social Connection and Isolation Panel [NHANES]    Frequency of Communication with Friends and Family: More than three times a week    Frequency of Social Gatherings with Friends and Family: More than three times a week    Attends Religious Services: More than 4 times per year    Active Member of [employer] or Organizations: No    Attends Banker Meetings: Never    Marital Status: Married  Catering manager Violence: Not At Risk (06/24/2023)   Humiliation, Afraid, Rape, and Kick questionnaire    Fear of Current or Ex-Partner: No    Emotionally Abused: No    Physically Abused: No    Sexually Abused: No    Family History  Problem Relation Age of Onset   Hyperlipidemia Other     Hypertension Other     Past Medical History:  Diagnosis Date   Diabetes mellitus (HCC)    ED (erectile dysfunction)    Glaucoma    Hyperlipidemia    Hypertension     History reviewed. No pertinent surgical history.  Current Outpatient Medications  Medication Sig Dispense Refill   Accu-Chek FastClix Lancets  MISC Use once daily. Dx E11.9 100 each 3   amLODipine (NORVASC) 10 MG tablet TAKE 1 TABLET(S) BY MOUTH 1 TIMES PER DAY *NEW PRESCRIPTION REQUEST* 30 tablet 10   aspirin 81 MG tablet Take 1 tablet (81 mg total) by mouth daily. 90 tablet 3   cloNIDine (CATAPRES) 0.1 MG tablet TAKE 1 TABLET(S) BY MOUTH 2 TIMES PER DAY *NEW PRESCRIPTION REQUEST* 60 tablet 10   cyanocobalamin (,VITAMIN B-12,) 1000 MCG/ML injection Inject 1 ml into the deltoid once weekly for 4 weeks.  Then inject 1 ml once monthly thereafter. 1 mL 11   furosemide (LASIX) 40 MG tablet Take 1 tablet (40 mg total) by mouth 2 (two) times daily. 60 tablet 4   glucose blood (ACCU-CHEK AVIVA PLUS) test strip USE ONE STRIP TO CHECK GLUCOSE ONCE DAILY 100 each 4   labetalol (NORMODYNE) 200 MG tablet TAKE 1 TABLET(S) BY MOUTH 2 TIMES PER DAY *NEW PRESCRIPTION REQUEST* 60 tablet 10   latanoprost (XALATAN) 0.005 % ophthalmic solution      metFORMIN (GLUCOPHAGE) 500 MG tablet TAKE 1 TABLET(S) BY MOUTH once daily in the morning. 30 tablet 9   sildenafil (REVATIO) 20 MG tablet Use as directed 20 tablet 10   sildenafil (VIAGRA) 100 MG tablet Take 1 tablet (100 mg total) by mouth at bedtime as needed for erectile dysfunction (for erectile dysfunction). 10 tablet 10   simvastatin (ZOCOR) 40 MG tablet TAKE 1 TABLET(S) BY MOUTH 1 TIMES PER DAY *NEW PRESCRIPTION REQUEST* 30 tablet 10   SYRINGE-NEEDLE, DISP, 3 ML (BD SAFETYGLIDE SYRINGE/NEEDLE) 25G X 1" 3 ML MISC Use for B12 injections 100 each 11   No current facility-administered medications for this visit.    Allergies as of 10/05/2023   (No Known Allergies)    Vitals: BP (!) 172/82    Pulse (!) 51   Ht 6' (1.829 m)   Wt 160 lb (72.6 kg)   BMI 21.70 kg/m  Last Weight:  Wt Readings from Last 1 Encounters:  10/05/23 160 lb (72.6 kg)   Last Height:   Ht Readings from Last 1 Encounters:  10/05/23 6' (1.829 m)    Physical exam:  General: The patient is awake, alert and appears not in acute distress.  Daniel Cochran lost a lot of weight,  The patient is well groomed. Head: Normocephalic, atraumatic.  Neck is supple.   Neck circumference:15 Cardiovascular:  Regular rate and palpable peripheral pulse:  Respiratory: clear to auscultation.  Mallampati 2, Skin:  Without evidence of edema, or rash Trunk: BMI is 21 ,  and patient  has normal posture.   Neurologic exam : The patient is awake and alert, oriented to place and time.  Memory subjective  described as impaired  There is a normal attention span & concentration ability.  Speech is fluent without dysphonia . Mood and affect are appropriate.  Cranial nerves: Pupils are equal and briskly reactive to light. Funduscopic exam without  evidence of pallor or edema. Extraocular movements  in vertical and horizontal planes intact and without nystagmus.  Corrective glasses. Visual fields by finger perimetry are intact. Hearing to finger rub intact.   Facial sensation intact to fine touch. Facial motor strength is symmetric and tongue and uvula move midline.  Motor exam:  very little  muscle bulk and  elevated tone in both biceps and wrist   symmetric normal strength in all extremities. Hip flexion was strong.  Cochran  thigh muscles are  atrophied.  Grip Strength equal but grip did  not involve ring or small finger.- .  Proximal strength of shoulder muscles and hip flexors was intact.  No fasciculation. .  Sensory:  Fine touch and vibration were tested . Finger tips are numb.  Feet are not.   Proprioception was tested in the upper extremities only and was  normal.  Coordination: Rapid alternating movements in the fingers/hands  were abrupt and  not exact.   Finger-to-nose maneuver was tested and showed  evidence of dysmetria , no tremor. Daniel Cochran mixed up left and right.   Gait and station: Patient walked without assistive device . Falls were reported.   Core Strength within normal limits.  Stance is stable and of normal base.  Daniel Cochran has normal arm swing, is not stooped Tandem gait is deferred., turns with 4 Steps which were unfragmented.  Romberg testing is normal.  Deep tendon reflexes: in the  upper and lower extremities are symmetric and  brisk  Babinski maneuver response is downgoing.   Assessment: Total time for face to face interview and examination, for review of  images and laboratory testing, neurophysiology testing and pre-existing records, including out-of -network , was 45 minutes. Assessment is as follows here:   Plan:  Treatment plan and additional workup planned after today includes:   1) This patient should no longer drive - MOCA 40/ 30 points.  I discussed this him but Daniel Cochran is not showing insight.   Daniel Cochran was scared when Daniel Cochran got lost on Cochran way.  Cochran wife is still driving and I suggested this should be Cochran  out-  for example coming to this appointment.    Limited validity in a memory concern patient who comes alone to the appointment.  Daniel Cochran got lost on Cochran way, explaining Cochran high blood pressure a upon arrival.   1)   I will order MRI, ATN and AD risk panel, ruling out  vascular dementia and AD,   2) upon questioning Daniel Cochran reported vivid dreams,  visions,  about a tractor.  Not nightmares.   3)  differential of Lewy body versus small vessel disease, depending on  AD panel results, will follow with PET amyloid or with PET metabolic scan.   If  a MMSE can be  performed next visit , I would be very appreciative,  if under 23/ 30 points would not need to discuss advanced treatment options.  I will initiate Aricept now.    Melvyn Novas, Daniel

## 2023-10-05 NOTE — Patient Instructions (Signed)
 Problems With Thinking and Memory (Mild Neurocognitive Disorder): What to Know Mild neurocognitive disorder, formerly known as mild cognitive impairment, is a disorder where your memory doesn't work as well as it should. It may also affect other mental abilities like thinking, communicating, behavior, and being able to finish tasks. These problems can be noticed and measured. But they usually don't stop you from doing daily activities or living on your own. Mild neurocognitive disorder usually happens after 83 years of age. But it can also happen at younger ages. It's not as serious as major neurocognitive disorder, also known as dementia, but it may be the first sign of it. In general, the symptoms of this condition get worse over time. In rare cases, symptoms can get better. What are the causes? This condition may be caused by: Brain disorders like Alzheimer's disease, Parkinson's disease, and other conditions that slowly damage nerve cells. Diseases that affect the blood vessels in the brain and cause small strokes. Certain infections, like HIV. Traumatic brain injury. Other medical conditions, such as brain tumors, underactive thyroid (hypothyroidism), and not having enough vitamin B12. Using certain drugs or medicines. What increases the risk? Being older than 83 years of age. Being male. Having a lower level of education. Diabetes, high blood pressure, high cholesterol, and other conditions that raise the risk for blood vessel diseases. Untreated or undertreated sleep apnea. Having a certain type of gene that can be inherited, or passed down from parent to child. Long-term health problems like heart disease, lung disease, liver disease, kidney disease, or depression. What are the signs or symptoms? Trouble remembering things. You may: Forget names, phone numbers, or details of recent events. Forget about social events and appointments. Often forget where you put your car keys or other  items. Trouble thinking and solving problems. You may have trouble with complex tasks like: Paying bills. Driving in places you don't know well. Trouble communicating. You may have trouble: Finding the right word or naming an object. Forming a sentence that makes sense. Understanding what you read or hear. Changes in your behavior or personality. When this happens, you may: Lose interest in the things you used to enjoy. Avoid being around people. Get angry more easily than usual. Act before thinking. How is this diagnosed? This condition is diagnosed based on: Your symptoms. Your health care provider may ask you and the people you spend time with, like family and friends, about your symptoms. Memory tests and other tests to check how your brain is working. Your provider may refer you to a provider called a neurologist or a mental health specialist. To try to find out the cause of your condition, your provider may: Get a detailed medical history. Ask about use of alcohol, drugs, and medicines. Do a physical exam. Order blood tests and brain imaging tests. How is this treated? Mild neurocognitive disorder that's caused by medicine use, drug use, infection, or another medical condition may get better when the cause is treated, or when medicines or drugs are stopped. If this disorder has another cause, it usually doesn't improve and may get worse. In these cases, the goal of treatment is to help you manage the symptoms. This may include: Medicines to help with memory and behavior symptoms. Talk therapy. This provides education, emotional support, memory aids, and other ways of making up for problems with mental tasks. Lifestyle changes. These may include: Getting regular exercise. Eating a healthy diet that includes omega-3 fatty acids. Doing things to challenge your thinking  and memory skills. Spending more time being with and talking to other people. Using routines like having regular  times for meals and going to bed. Follow these instructions at home: Eating and drinking  Drink more fluids as told. Eat a healthy diet that includes omega-3 fatty acids. These can be found in: Fish. Nuts. Leafy vegetables. Vegetable oils. If you drink alcohol: Limit how much you have to: 0-1 drink a day if you're male. 0-2 drinks a day if you're male. Know how much alcohol is in your drink. In the [place] one drink is one 12 oz bottle of beer (355 mL), one 5 oz glass of wine (148 mL), or one 1 oz glass of hard liquor (44 mL). Lifestyle  Get regular exercise as told by your provider. Do not smoke, vape, or use nicotine or tobacco. Use healthy ways to manage stress. If you need help managing stress, ask your provider. Keep spending time with other people. Keep your mind active by doing activities you enjoy, like reading or playing games. Make sure you get good sleep at night. These tips can help: Try not to take naps during the day. Keep your bedroom dark and cool. Do not exercise in the few hours before you go to bed. Do not have foods or drinks with caffeine at night. General instructions Take medicines only as told. Your provider may tell you to avoid taking medicines that can affect thinking. These include some medicines for pain or sleeping. Work with your provider to find out: What things you need help with. What your safety needs are. Where to find more information [employer] on Aging: [email or web address] Contact a health care provider if: You have any new symptoms. Get help right away if: You have new confusion or your confusion gets worse. You act in ways that put you or your family in danger. This information is not intended to replace advice given to you by your health care provider. Make sure you discuss any questions you have with your health care provider. Document Revised: 12/29/2022 Document Reviewed: 12/29/2022 Elsevier Patient Education  2024 [employer]Memory Compensation Strategies  Use "WARM" strategy.  W= write it down  A= associate it  R= repeat it  M= make a mental note  2.   You can keep a Glass blower/designer.  Use a 3-ring notebook with sections for the following: calendar, important names and phone numbers,  medications, doctors' names/phone numbers, lists/reminders, and a section to journal what you did  each day.   3.    Use a calendar to write appointments down.  4.    Write yourself a schedule for the day.  This can be placed on the calendar or in a separate section of the Memory Notebook.  Keeping a  regular schedule can help memory.  5.    Use medication organizer with sections for each day or morning/evening pills.  You may need help loading it  6.    Keep a basket, or pegboard by the door.  Place items that you need to take out with you in the basket or on the pegboard.  You may also want to  include a message board for reminders.  7.    Use sticky notes.  Place sticky notes with reminders in a place where the task is performed.  For example: " turn off the  stove" placed by the stove, "lock the door" placed on the door at eye level, " take your medications" on  the  bathroom mirror or by the place where you normally take your medications.  8.    Use alarms/timers.  Use while cooking to remind yourself to check on food or as a reminder to take your medicine, or as a  reminder to make a call, or as a reminder to perform another task, etc.

## 2023-10-06 ENCOUNTER — Telehealth: Payer: Self-pay | Admitting: Neurology

## 2023-10-06 ENCOUNTER — Ambulatory Visit (INDEPENDENT_AMBULATORY_CARE_PROVIDER_SITE_OTHER)

## 2023-10-06 DIAGNOSIS — R413 Other amnesia: Secondary | ICD-10-CM

## 2023-10-06 NOTE — Patient Instructions (Signed)
 For your evening pill pack, please take out the round white pill that says G10. This is the metformin evening dose.  Please make sure to continue to monitor blood pressure at home.   I will call you on 10/25/23 at 10:30am to follow up but feel free to reach out sooner if any concerns.  Sherrill Raring, PharmD Clinical Pharmacist [phone]

## 2023-10-06 NOTE — Telephone Encounter (Signed)
 MR brain w/wo sent to GI for scheduling, NPR.

## 2023-10-06 NOTE — Progress Notes (Signed)
   10/06/2023  Patient ID: Daniel Cochran, male   DOB: 1941-06-29, 83 y.o.   MRN: [id number]  Patient presented in office with his pill pack for evening doses. His metformin has recently been decreased to one tab daily in the morning and he needs the pill identified so he knows which one to skip from his evening pack.  Pill identified for patient and description sent with him to reference at home. Pharmacy also has updated instructions so patient should not have to take out for future fills.   Counseled to monitor BP at home also as previously instructed.  Follow Up: 10/25/23  Sherrill Raring, PharmD Clinical Pharmacist [phone]

## 2023-10-09 LAB — ATN PROFILE
A -- Beta-amyloid 42/40 Ratio: 0.097 — ABNORMAL LOW (ref >0.102)
Beta-amyloid 40: 274.97 pg/mL
Beta-amyloid 42: 26.71 pg/mL
N -- NfL, Plasma: 8.6 pg/mL (ref 0.00–11.55)
T -- p-tau181: 3.68 pg/mL — ABNORMAL HIGH (ref 0.00–0.97)

## 2023-10-09 LAB — APOE ALZHEIMER'S RISK

## 2023-10-11 DIAGNOSIS — R413 Other amnesia: Secondary | ICD-10-CM | POA: Diagnosis not present

## 2023-10-11 DIAGNOSIS — Z9181 History of falling: Secondary | ICD-10-CM | POA: Diagnosis not present

## 2023-10-11 DIAGNOSIS — E119 Type 2 diabetes mellitus without complications: Secondary | ICD-10-CM | POA: Diagnosis not present

## 2023-10-11 DIAGNOSIS — M51379 Other intervertebral disc degeneration, lumbosacral region without mention of lumbar back pain or lower extremity pain: Secondary | ICD-10-CM | POA: Diagnosis not present

## 2023-10-11 DIAGNOSIS — E782 Mixed hyperlipidemia: Secondary | ICD-10-CM | POA: Diagnosis not present

## 2023-10-11 DIAGNOSIS — H409 Unspecified glaucoma: Secondary | ICD-10-CM | POA: Diagnosis not present

## 2023-10-11 DIAGNOSIS — E538 Deficiency of other specified B group vitamins: Secondary | ICD-10-CM | POA: Diagnosis not present

## 2023-10-11 DIAGNOSIS — E559 Vitamin D deficiency, unspecified: Secondary | ICD-10-CM | POA: Diagnosis not present

## 2023-10-11 DIAGNOSIS — H903 Sensorineural hearing loss, bilateral: Secondary | ICD-10-CM | POA: Diagnosis not present

## 2023-10-11 DIAGNOSIS — H6993 Unspecified Eustachian tube disorder, bilateral: Secondary | ICD-10-CM | POA: Diagnosis not present

## 2023-10-11 DIAGNOSIS — I10 Essential (primary) hypertension: Secondary | ICD-10-CM | POA: Diagnosis not present

## 2023-10-11 DIAGNOSIS — Z7984 Long term (current) use of oral hypoglycemic drugs: Secondary | ICD-10-CM | POA: Diagnosis not present

## 2023-10-14 ENCOUNTER — Other Ambulatory Visit: Payer: Self-pay | Admitting: *Deleted

## 2023-10-14 DIAGNOSIS — E119 Type 2 diabetes mellitus without complications: Secondary | ICD-10-CM

## 2023-10-14 DIAGNOSIS — E785 Hyperlipidemia, unspecified: Secondary | ICD-10-CM

## 2023-10-14 DIAGNOSIS — I10 Essential (primary) hypertension: Secondary | ICD-10-CM

## 2023-10-14 MED ORDER — SIMVASTATIN 40 MG PO TABS
40.0000 mg | ORAL_TABLET | Freq: Every day | ORAL | 1 refills | Status: DC
Start: 2023-10-14 — End: 2023-10-26

## 2023-10-14 MED ORDER — CLONIDINE HCL 0.1 MG PO TABS: 0.1000 mg | ORAL_TABLET | Freq: Two times a day (BID) | ORAL | 1 refills | Status: DC

## 2023-10-14 MED ORDER — AMLODIPINE BESYLATE 10 MG PO TABS: 10.0000 mg | ORAL_TABLET | Freq: Every day | ORAL | 1 refills | Status: DC

## 2023-10-14 MED ORDER — METFORMIN HCL 500 MG PO TABS: ORAL_TABLET | ORAL | 1 refills | Status: DC

## 2023-10-14 MED ORDER — LABETALOL HCL 200 MG PO TABS: 200.0000 mg | ORAL_TABLET | Freq: Two times a day (BID) | ORAL | 1 refills | Status: DC

## 2023-10-14 MED ORDER — FUROSEMIDE 40 MG PO TABS: 40.0000 mg | ORAL_TABLET | Freq: Two times a day (BID) | ORAL | 1 refills | Status: DC

## 2023-10-18 DIAGNOSIS — I10 Essential (primary) hypertension: Secondary | ICD-10-CM | POA: Diagnosis not present

## 2023-10-18 DIAGNOSIS — H409 Unspecified glaucoma: Secondary | ICD-10-CM | POA: Diagnosis not present

## 2023-10-18 DIAGNOSIS — Z9181 History of falling: Secondary | ICD-10-CM | POA: Diagnosis not present

## 2023-10-18 DIAGNOSIS — H6993 Unspecified Eustachian tube disorder, bilateral: Secondary | ICD-10-CM | POA: Diagnosis not present

## 2023-10-18 DIAGNOSIS — M51379 Other intervertebral disc degeneration, lumbosacral region without mention of lumbar back pain or lower extremity pain: Secondary | ICD-10-CM | POA: Diagnosis not present

## 2023-10-18 DIAGNOSIS — R413 Other amnesia: Secondary | ICD-10-CM | POA: Diagnosis not present

## 2023-10-18 DIAGNOSIS — E119 Type 2 diabetes mellitus without complications: Secondary | ICD-10-CM | POA: Diagnosis not present

## 2023-10-18 DIAGNOSIS — H903 Sensorineural hearing loss, bilateral: Secondary | ICD-10-CM | POA: Diagnosis not present

## 2023-10-18 DIAGNOSIS — Z7984 Long term (current) use of oral hypoglycemic drugs: Secondary | ICD-10-CM | POA: Diagnosis not present

## 2023-10-18 DIAGNOSIS — Z7982 Long term (current) use of aspirin: Secondary | ICD-10-CM | POA: Diagnosis not present

## 2023-10-18 DIAGNOSIS — E559 Vitamin D deficiency, unspecified: Secondary | ICD-10-CM | POA: Diagnosis not present

## 2023-10-18 DIAGNOSIS — E538 Deficiency of other specified B group vitamins: Secondary | ICD-10-CM | POA: Diagnosis not present

## 2023-10-18 DIAGNOSIS — E782 Mixed hyperlipidemia: Secondary | ICD-10-CM | POA: Diagnosis not present

## 2023-10-20 ENCOUNTER — Telehealth: Payer: Self-pay

## 2023-10-20 NOTE — Telephone Encounter (Signed)
I called patient to discuss. No answer, left a message asking him to call us back.  

## 2023-10-20 NOTE — Telephone Encounter (Signed)
-----   Message from [facility] sent at 10/12/2023 12:59 PM EDT ----- Biomarker for Alzheimer's disease are present. P tau level is actually very high.   No genetically elevated risk ( important for his children)  The MOCA score of 14 / 30 would be too low-  we need to do MMSE  in the future. If lower than 23 / 30  I would not consider him qa candidate for MAB therapy.  I would  therefor not need to order an amyloid PET scan.  MRI pending to rule out vascular dementia as co -factor.   If MRI unremarkable for alternative of overlapping causes of Dementia , he will return to PCP with a social work consult.

## 2023-10-21 NOTE — Telephone Encounter (Signed)
 Spoke w/Pt regarding his test results and that Dr Vickey Huger does not think a PET scan is needed at this time. Informed Pt that Dr. Vickey Huger does want [place] to do a memory test at his next visit in July. Discussed that if MRI shows no findings Dr. Vickey Huger will refer him back to his PCP for a social work consult. Pt stated understanding and thanked for the call.  Noted MRI has not been scheduled, have reached out for f/u on scheduling.

## 2023-10-21 NOTE — Telephone Encounter (Signed)
 Pt returned phone call, would like a call back.

## 2023-10-21 NOTE — Telephone Encounter (Signed)
 Sent to GI again to schedule. [phone]

## 2023-10-23 DIAGNOSIS — I469 Cardiac arrest, cause unspecified: Secondary | ICD-10-CM | POA: Diagnosis not present

## 2023-10-23 DIAGNOSIS — I499 Cardiac arrhythmia, unspecified: Secondary | ICD-10-CM | POA: Diagnosis not present

## 2023-10-23 DIAGNOSIS — R092 Respiratory arrest: Secondary | ICD-10-CM | POA: Diagnosis not present

## 2023-10-23 DIAGNOSIS — Z743 Need for continuous supervision: Secondary | ICD-10-CM | POA: Diagnosis not present

## 2023-10-23 DIAGNOSIS — R404 Transient alteration of awareness: Secondary | ICD-10-CM | POA: Diagnosis not present

## 2023-10-25 ENCOUNTER — Telehealth: Payer: Self-pay

## 2023-10-25 ENCOUNTER — Other Ambulatory Visit

## 2023-10-25 NOTE — Telephone Encounter (Signed)
 Spoke w/Pt wife regarding results and upcoming MRI. Spouse reported Pt died Nov 21, 2023 2023-11-07, stated does not know the cause as she found him in the home already deceased.

## 2023-10-25 NOTE — Telephone Encounter (Signed)
 I cancelled his MRI order and his appointment with Dr. Vickey Huger in July.

## 2023-10-25 NOTE — Telephone Encounter (Signed)
-----   Message from [facility] sent at 10/12/2023 12:59 PM EDT ----- Biomarker for Alzheimer's disease are present. P tau level is actually very high.   No genetically elevated risk ( important for his children)  The MOCA score of 14 / 30 would be too low-  we need to do MMSE  in the future. If lower than 23 / 30  I would not consider him qa candidate for MAB therapy.  I would  therefor not need to order an amyloid PET scan.  MRI pending to rule out vascular dementia as co -factor.   If MRI unremarkable for alternative of overlapping causes of Dementia , he will return to PCP with a social work consult.

## 2023-11-18 DEATH — deceased

## 2024-02-09 ENCOUNTER — Ambulatory Visit: Admitting: Neurology
